# Patient Record
Sex: Female | Born: 1955 | Race: White | Hispanic: No | Smoking: Former smoker
Health system: Southern US, Community
[De-identification: ages and names within clinical notes are randomized; demographics above are authoritative.]

## PROBLEM LIST (undated history)

## (undated) DIAGNOSIS — M329 Systemic lupus erythematosus, unspecified: Secondary | ICD-10-CM

## (undated) DIAGNOSIS — Z973 Presence of spectacles and contact lenses: Secondary | ICD-10-CM

## (undated) DIAGNOSIS — M1990 Unspecified osteoarthritis, unspecified site: Secondary | ICD-10-CM

## (undated) DIAGNOSIS — R519 Headache, unspecified: Secondary | ICD-10-CM

## (undated) DIAGNOSIS — Z9889 Other specified postprocedural states: Secondary | ICD-10-CM

## (undated) DIAGNOSIS — R51 Headache: Secondary | ICD-10-CM

## (undated) DIAGNOSIS — R112 Nausea with vomiting, unspecified: Secondary | ICD-10-CM

## (undated) DIAGNOSIS — F419 Anxiety disorder, unspecified: Secondary | ICD-10-CM

## (undated) DIAGNOSIS — M3500 Sicca syndrome, unspecified: Secondary | ICD-10-CM

## (undated) DIAGNOSIS — Z972 Presence of dental prosthetic device (complete) (partial): Secondary | ICD-10-CM

## (undated) DIAGNOSIS — D0361 Melanoma in situ of right upper limb, including shoulder: Secondary | ICD-10-CM

## (undated) DIAGNOSIS — E039 Hypothyroidism, unspecified: Secondary | ICD-10-CM

## (undated) HISTORY — PX: DILATION AND CURETTAGE OF UTERUS: SHX78

## (undated) HISTORY — PX: TONSILLECTOMY: SUR1361

---

## 1982-03-15 HISTORY — PX: TUBAL LIGATION: SHX77

## 1988-11-13 HISTORY — PX: OTHER SURGICAL HISTORY: SHX169

## 1988-11-13 HISTORY — PX: CYSTOSCOPY W/ RETROGRADES: SHX1426

## 1997-10-03 ENCOUNTER — Ambulatory Visit (HOSPITAL_COMMUNITY): Admission: RE | Admit: 1997-10-03 | Discharge: 1997-10-03 | Payer: Self-pay | Admitting: Gastroenterology

## 1997-10-14 ENCOUNTER — Ambulatory Visit (HOSPITAL_COMMUNITY): Admission: RE | Admit: 1997-10-14 | Discharge: 1997-10-14 | Payer: Self-pay | Admitting: Gastroenterology

## 1997-11-19 ENCOUNTER — Ambulatory Visit (HOSPITAL_COMMUNITY): Admission: RE | Admit: 1997-11-19 | Discharge: 1997-11-19 | Payer: Self-pay | Admitting: Gastroenterology

## 1997-11-27 ENCOUNTER — Ambulatory Visit (HOSPITAL_COMMUNITY): Admission: RE | Admit: 1997-11-27 | Discharge: 1997-11-27 | Payer: Self-pay | Admitting: Gastroenterology

## 1997-11-27 ENCOUNTER — Encounter: Payer: Self-pay | Admitting: Gastroenterology

## 1997-12-06 ENCOUNTER — Encounter: Payer: Self-pay | Admitting: Gastroenterology

## 1997-12-06 ENCOUNTER — Ambulatory Visit (HOSPITAL_COMMUNITY): Admission: RE | Admit: 1997-12-06 | Discharge: 1997-12-06 | Payer: Self-pay | Admitting: Gastroenterology

## 1998-09-10 ENCOUNTER — Other Ambulatory Visit: Admission: RE | Admit: 1998-09-10 | Discharge: 1998-09-10 | Payer: Self-pay | Admitting: Obstetrics and Gynecology

## 1999-09-29 ENCOUNTER — Encounter: Payer: Self-pay | Admitting: Obstetrics and Gynecology

## 1999-09-29 ENCOUNTER — Encounter: Admission: RE | Admit: 1999-09-29 | Discharge: 1999-09-29 | Payer: Self-pay | Admitting: Obstetrics and Gynecology

## 1999-10-12 ENCOUNTER — Other Ambulatory Visit: Admission: RE | Admit: 1999-10-12 | Discharge: 1999-10-12 | Payer: Self-pay | Admitting: Obstetrics and Gynecology

## 2000-10-12 ENCOUNTER — Other Ambulatory Visit: Admission: RE | Admit: 2000-10-12 | Discharge: 2000-10-12 | Payer: Self-pay | Admitting: Obstetrics and Gynecology

## 2000-10-13 ENCOUNTER — Encounter: Admission: RE | Admit: 2000-10-13 | Discharge: 2000-10-13 | Payer: Self-pay | Admitting: Obstetrics and Gynecology

## 2000-10-13 ENCOUNTER — Encounter: Payer: Self-pay | Admitting: Obstetrics and Gynecology

## 2002-08-01 ENCOUNTER — Encounter: Payer: Self-pay | Admitting: Obstetrics and Gynecology

## 2002-08-01 ENCOUNTER — Encounter: Admission: RE | Admit: 2002-08-01 | Discharge: 2002-08-01 | Payer: Self-pay | Admitting: Obstetrics and Gynecology

## 2003-09-19 ENCOUNTER — Encounter: Admission: RE | Admit: 2003-09-19 | Discharge: 2003-09-19 | Payer: Self-pay | Admitting: Obstetrics and Gynecology

## 2004-11-04 ENCOUNTER — Encounter: Admission: RE | Admit: 2004-11-04 | Discharge: 2004-11-04 | Payer: Self-pay | Admitting: Obstetrics and Gynecology

## 2005-10-28 ENCOUNTER — Encounter: Admission: RE | Admit: 2005-10-28 | Discharge: 2005-10-28 | Payer: Self-pay | Admitting: Obstetrics and Gynecology

## 2005-11-04 ENCOUNTER — Encounter: Admission: RE | Admit: 2005-11-04 | Discharge: 2005-11-04 | Payer: Self-pay | Admitting: Obstetrics and Gynecology

## 2006-01-27 ENCOUNTER — Encounter: Admission: RE | Admit: 2006-01-27 | Discharge: 2006-01-27 | Payer: Self-pay | Admitting: Family Medicine

## 2006-10-31 ENCOUNTER — Encounter: Admission: RE | Admit: 2006-10-31 | Discharge: 2006-10-31 | Payer: Self-pay | Admitting: Obstetrics and Gynecology

## 2007-11-01 ENCOUNTER — Encounter: Admission: RE | Admit: 2007-11-01 | Discharge: 2007-11-01 | Payer: Self-pay | Admitting: Obstetrics and Gynecology

## 2008-04-15 ENCOUNTER — Encounter: Admission: RE | Admit: 2008-04-15 | Discharge: 2008-04-15 | Payer: Self-pay | Admitting: Family Medicine

## 2009-12-17 ENCOUNTER — Encounter: Payer: Self-pay | Admitting: Pulmonary Disease

## 2009-12-25 ENCOUNTER — Encounter: Payer: Self-pay | Admitting: Pulmonary Disease

## 2009-12-25 ENCOUNTER — Encounter: Admission: RE | Admit: 2009-12-25 | Discharge: 2009-12-25 | Payer: Self-pay | Admitting: Rheumatology

## 2009-12-29 ENCOUNTER — Encounter: Payer: Self-pay | Admitting: Pulmonary Disease

## 2010-01-12 DIAGNOSIS — M329 Systemic lupus erythematosus, unspecified: Secondary | ICD-10-CM | POA: Insufficient documentation

## 2010-01-12 DIAGNOSIS — E039 Hypothyroidism, unspecified: Secondary | ICD-10-CM | POA: Insufficient documentation

## 2010-01-12 DIAGNOSIS — R0602 Shortness of breath: Secondary | ICD-10-CM | POA: Insufficient documentation

## 2010-01-13 ENCOUNTER — Ambulatory Visit: Payer: Self-pay | Admitting: Pulmonary Disease

## 2010-01-13 DIAGNOSIS — J984 Other disorders of lung: Secondary | ICD-10-CM | POA: Insufficient documentation

## 2010-01-15 ENCOUNTER — Ambulatory Visit: Payer: Self-pay | Admitting: Pulmonary Disease

## 2010-04-05 ENCOUNTER — Encounter: Payer: Self-pay | Admitting: Obstetrics and Gynecology

## 2010-04-14 NOTE — Assessment & Plan Note (Signed)
Summary: consult for dyspnea   Visit Type:  Initial Consult Copy to:  Zenovia Jordan MD Primary Gwendloyn Forsee/Referring Ediberto Sens:  Lupita Raider  CC:  pulonary consult. pt increased sob with exertion and at rest. pt has increased dizziness.  History of Present Illness: The pt is a 54y/o female who I have been asked to see for dyspnea.  She has a diagnosis of systemic lupus, and has noted some increased doe over the past 2mos.  She has seen this with brisk ADL's and on rare occasions with conversation.  Has also noted with her housework.  It is not an overwhelming feeling of sob, but feels different than her usual baseline.  She often feels she cannot "get a deep enough breath", and on occasions can have air hunger.  She denies any LE edema, and has no cardiac history of significance.  She denies any cough or mucus production, and has not had any pleuritic chest pain.  She has had some reflux symptoms with belching.  Her lupus is being treated with prednisone, mtx, and plaquenil, although her mtx has been held for the last 3 weeks.  Her TSH was recently found to be elevated, and her medication is being adjusted.  The pt does have a h/o minimal smoking for 24yrs, but quit one year ago.  She has been evaluated with an echo which shows normal LV fxn and no evidence for pulmonary htn.  She has had a ct chest which showed no ISLD, but did show an incidental 2.62mm nodule in the RLL.  Current Medications (verified): 1)  Vitamin D 1000 Unit Tabs (Cholecalciferol) .Marland Kitchen.. 1 Once Daily 2)  Prednisone 5 Mg Tabs (Prednisone) .Marland Kitchen.. 1 Once Daily 3)  Ambien 5 Mg Tabs (Zolpidem Tartrate) .Marland Kitchen.. 1 At Bedtime As Needed 4)  Synthroid 100 Mcg Tabs (Levothyroxine Sodium) .... Once Daily 5)  Hydroxychloroquine Sulfate 200 Mg Tabs (Hydroxychloroquine Sulfate) .... 2 Once Daily 6)  Folic Acid 1 Mg Tabs (Folic Acid) .Marland Kitchen.. 1 Once Daily 7)  Flonase 50 Mcg/act Susp (Fluticasone Propionate) .Marland Kitchen.. 1 Sray Each Nostril Once Daily As Needed 8)   Align  Caps (Probiotic Product) .... Once Daily  Allergies (verified): No Known Drug Allergies  Past History:  Past Medical History:  UNSPECIFIED HYPOTHYROIDISM (ICD-244.9) SYSTEMIC LUPUS ERYTHEMATOSUS (ICD-710.0)    Past Surgical History: left/right  breast lumpectomy tubal ligation  Family History: Reviewed history from 01/12/2010 and no changes required. HTN- Father Hyperlipidemia- Father and Mother AODM- Mother cancer--father  Social History: Reviewed history from 01/12/2010 and no changes required. Former smoker. quit smoking 2010. started 2006. 1/2 ppd.  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  The patient denies productive cough, non-productive cough, coughing up blood, chest pain, irregular heartbeats, acid heartburn, loss of appetite, difficulty swallowing, tooth/dental problems, headaches, sneezing, itching, ear ache, anxiety, depression, hand/feet swelling, joint stiffness or pain, rash, change in color of mucus, and fever.    Vital Signs:  Patient profile:   55 year old female Height:      68 inches Weight:      125.50 pounds BMI:     19.15 O2 Sat:      99 % on Room air Temp:     98.4 degrees F oral Pulse rate:   82 / minute BP sitting:  112 / 78  (left arm) Cuff size:   regular  Vitals Entered By: Carver Fila (January 13, 2010 11:37 AM)  O2 Flow:  Room air CC: pulonary consult. pt increased sob with exertion and at rest. pt has increased dizziness Comments meds and alllergies updated Phone number updated Carver Fila  January 13, 2010 11:37 AM    Physical Exam  General:  thin female in nad Eyes:  PERRLA and EOMI.   Nose:  patent without discharge Mouth:  clear, no exudates or lesions noted Neck:  no  jvd,tmg, LN Lungs:  totally clear to auscultation Heart:  rrr, no mrg Abdomen:  soft and nontender, bs+ Extremities:  no edema or cyanosis, pulses intact distally Neurologic:  alert and oriented, moves all 4.   Impression & Recommendations:  Problem # 1:  DYSPNEA (ICD-786.05) the pt has doe that is interfering with her QOL.  She has a h/o lupus, but her HRCT shows no ISLD and her echo does not show pulmonary htn.  She has a h/o smoking, but minimal.  I do think she needs to have full pfts for evaluation to exclude obstructive or restrictive disease.  SLE can affect diaphragm function, and she does c/o "inability to take a full breath".  She also has a h/o c/w reflux, and it may be worthwhile to treat her with a ppi if nothing else is found (I have seen pts with reflux presenting with sob).  If her pfts are unremarkable, and she continues to have symptoms, it may be worthwhile doing cardiopulmonary stress testing (CPST) with exercise bike and inhale/exhale gas analysis.  Problem # 2:  PULMONARY NODULE, RIGHT LOWER LOBE (ICD-518.89) the pt was found to have an incidental 2.59mm nodule in RLL on ct chest.  She is low risk for lung cancer, but her health maintenance is not up to date per pt.  Would recommend a f/u ct in one year, and I have asked pt to get back with her primary md to catch up on general health maintenance.  Medications Added to Medication List This Visit: 1)  Synthroid 100 Mcg Tabs (Levothyroxine sodium) .... Once daily 2)  Align Caps (Probiotic product) .... Once daily  Other Orders: Consultation Level IV (01027) Pulmonary Referral (Pulmonary)  Patient Instructions: 1)  will for breathing tests, and will call you with results. 2)  will send a copy of my note to Dr Nickola Major and your primary MD.

## 2010-04-14 NOTE — Miscellaneous (Signed)
Summary: Orders Update pft charges  Clinical Lists Changes  Orders: Added new Service order of Carbon Monoxide diffusing w/capacity [phone]) - Signed Added new Service order of Lung Volumes (60454) - Signed Added new Service order of Spirometry (Pre & Post) (09811) - Signed  Appended Document: Orders Update pft charges pfts normal.  discussed with pt....she would like to see how her symptoms go at this point.  Will work on conditioning.

## 2010-04-14 NOTE — Letter (Signed)
Summary: [facility]   Imported By: Sherian Rein 02/02/2010 07:49:17  _____________________________________________________________________  External Attachment:    Type:   Image     Comment:   External Document

## 2010-10-08 ENCOUNTER — Other Ambulatory Visit: Payer: Self-pay | Admitting: Family Medicine

## 2010-10-08 DIAGNOSIS — Z1231 Encounter for screening mammogram for malignant neoplasm of breast: Secondary | ICD-10-CM

## 2010-10-19 ENCOUNTER — Ambulatory Visit
Admission: RE | Admit: 2010-10-19 | Discharge: 2010-10-19 | Disposition: A | Discharge status: Home / Self Care | Payer: 59 | Source: Ambulatory Visit | Attending: Family Medicine | Admitting: Family Medicine

## 2010-10-19 DIAGNOSIS — Z1231 Encounter for screening mammogram for malignant neoplasm of breast: Secondary | ICD-10-CM

## 2011-09-21 ENCOUNTER — Other Ambulatory Visit: Payer: Self-pay | Admitting: Family Medicine

## 2011-09-21 DIAGNOSIS — Z1231 Encounter for screening mammogram for malignant neoplasm of breast: Secondary | ICD-10-CM

## 2011-10-26 ENCOUNTER — Ambulatory Visit
Admission: RE | Admit: 2011-10-26 | Discharge: 2011-10-26 | Disposition: A | Discharge status: Home / Self Care | Payer: 59 | Source: Ambulatory Visit | Attending: Family Medicine | Admitting: Family Medicine

## 2011-10-26 DIAGNOSIS — Z1231 Encounter for screening mammogram for malignant neoplasm of breast: Secondary | ICD-10-CM

## 2012-08-01 ENCOUNTER — Other Ambulatory Visit: Payer: Self-pay | Admitting: Family Medicine

## 2012-08-01 DIAGNOSIS — E28319 Asymptomatic premature menopause: Secondary | ICD-10-CM

## 2012-08-01 DIAGNOSIS — IMO0002 Reserved for concepts with insufficient information to code with codable children: Secondary | ICD-10-CM

## 2012-08-14 ENCOUNTER — Ambulatory Visit
Admission: RE | Admit: 2012-08-14 | Discharge: 2012-08-14 | Disposition: A | Discharge status: Home / Self Care | Payer: 59 | Source: Ambulatory Visit | Attending: Family Medicine | Admitting: Family Medicine

## 2012-08-14 DIAGNOSIS — E28319 Asymptomatic premature menopause: Secondary | ICD-10-CM

## 2012-08-14 DIAGNOSIS — IMO0002 Reserved for concepts with insufficient information to code with codable children: Secondary | ICD-10-CM

## 2012-10-11 ENCOUNTER — Other Ambulatory Visit: Payer: Self-pay

## 2012-10-11 DIAGNOSIS — Z1231 Encounter for screening mammogram for malignant neoplasm of breast: Secondary | ICD-10-CM

## 2012-10-30 ENCOUNTER — Ambulatory Visit
Admission: RE | Admit: 2012-10-30 | Discharge: 2012-10-30 | Disposition: A | Discharge status: Home / Self Care | Payer: 59 | Source: Ambulatory Visit

## 2012-10-30 DIAGNOSIS — Z1231 Encounter for screening mammogram for malignant neoplasm of breast: Secondary | ICD-10-CM

## 2013-07-18 ENCOUNTER — Other Ambulatory Visit: Payer: Self-pay | Admitting: Family Medicine

## 2013-07-18 ENCOUNTER — Other Ambulatory Visit (HOSPITAL_COMMUNITY)
Admission: RE | Admit: 2013-07-18 | Discharge: 2013-07-18 | Disposition: A | Discharge status: Home / Self Care | Payer: 59 | Source: Ambulatory Visit | Attending: Family Medicine | Admitting: Family Medicine

## 2013-07-18 DIAGNOSIS — Z1151 Encounter for screening for human papillomavirus (HPV): Secondary | ICD-10-CM | POA: Insufficient documentation

## 2013-07-18 DIAGNOSIS — Z124 Encounter for screening for malignant neoplasm of cervix: Secondary | ICD-10-CM | POA: Insufficient documentation

## 2014-09-24 ENCOUNTER — Other Ambulatory Visit: Payer: Self-pay

## 2014-09-24 DIAGNOSIS — Z1231 Encounter for screening mammogram for malignant neoplasm of breast: Secondary | ICD-10-CM

## 2014-09-27 ENCOUNTER — Other Ambulatory Visit: Payer: Self-pay | Admitting: Family Medicine

## 2014-09-27 DIAGNOSIS — M8580 Other specified disorders of bone density and structure, unspecified site: Secondary | ICD-10-CM

## 2014-10-16 ENCOUNTER — Ambulatory Visit

## 2014-10-16 ENCOUNTER — Ambulatory Visit
Admission: RE | Admit: 2014-10-16 | Discharge: 2014-10-16 | Disposition: A | Discharge status: Home / Self Care | Payer: 59 | Source: Ambulatory Visit

## 2014-10-16 ENCOUNTER — Ambulatory Visit
Admission: RE | Admit: 2014-10-16 | Discharge: 2014-10-16 | Disposition: A | Discharge status: Home / Self Care | Payer: 59 | Source: Ambulatory Visit | Attending: Family Medicine | Admitting: Family Medicine

## 2014-10-16 DIAGNOSIS — M8580 Other specified disorders of bone density and structure, unspecified site: Secondary | ICD-10-CM

## 2014-10-16 DIAGNOSIS — Z1231 Encounter for screening mammogram for malignant neoplasm of breast: Secondary | ICD-10-CM

## 2015-05-14 ENCOUNTER — Other Ambulatory Visit: Payer: Self-pay | Admitting: Family Medicine

## 2015-05-21 ENCOUNTER — Telehealth: Payer: Self-pay | Admitting: General Surgery

## 2015-05-21 ENCOUNTER — Encounter: Payer: Self-pay | Admitting: General Surgery

## 2015-05-21 NOTE — Telephone Encounter (Signed)
new patient appt-s/w Diane @ Dr. Brigitte Pulse office and gave np appt for 03/14 @ 1 w/Dr. Rolanda Jay.  Referring Dr. [name] Dx- Dem Malignant forearm  Referral information scan under media tab

## 2015-05-27 ENCOUNTER — Encounter: Payer: Self-pay | Admitting: General Surgery

## 2015-05-27 ENCOUNTER — Encounter: Payer: Self-pay | Admitting: *Deleted

## 2015-05-27 ENCOUNTER — Ambulatory Visit (HOSPITAL_BASED_OUTPATIENT_CLINIC_OR_DEPARTMENT_OTHER): Payer: 59 | Admitting: General Surgery

## 2015-05-27 VITALS — BP 139/65 | HR 94 | Temp 98.3°F | Resp 20 | Ht 67.0 in | Wt 121.8 lb

## 2015-05-27 DIAGNOSIS — Z803 Family history of malignant neoplasm of breast: Secondary | ICD-10-CM

## 2015-05-27 DIAGNOSIS — C4361 Malignant melanoma of right upper limb, including shoulder: Secondary | ICD-10-CM | POA: Diagnosis not present

## 2015-05-27 DIAGNOSIS — Z87891 Personal history of nicotine dependence: Secondary | ICD-10-CM | POA: Diagnosis not present

## 2015-05-27 DIAGNOSIS — Z809 Family history of malignant neoplasm, unspecified: Secondary | ICD-10-CM

## 2015-05-27 NOTE — Progress Notes (Signed)
[facility] NOTE  Patient Care Team: [name], MD as PCP - General (Family Medicine)  CHIEF COMPLAINTS/PURPOSE OF CONSULTATION: Newly diagnosed malignant melanoma right forearm  HISTORY OF PRESENTING ILLNESS:  Mrs. Kreke is a very pleasant 60 year old lady who reports seeing her primary care physician in July of last year with an abnormal mole of the right mid third dorsal forearm. This was placed in surveillance and she reported that in the Fall and began to change where she describes it coming up to a head and spontaneously drained a small amount of fluid and became thicker. After the under holidays she return for further evaluation and a biopsy was performed with the diagnosis of malignant melanoma. She was referred here for further evaluation and treatment. She denies any history of abnormal mole or abnormal syndrome. She denies any prior history of malignant melanoma. She denies any history of blistering sunburns.   I reviewed her records extensively and collaborated the history with the patient.  SUMMARY OF ONCOLOGIC HISTORY:  Newly diagnosed right mid third dorsal forearm malignant melanoma     MEDICAL HISTORY:  Systemic lupus. Sjgren syndrome. Hypothyroidism. Anxiety. Newly diagnosed malignant melanoma right forearm.   SURGICAL HISTORY: Tonsillectomy in her 13s. Tubal ligation. Diagnostic laparoscopy for recurrent urinary tract infections. Excisional breast biopsies 2 with benign diagnoses. Prior mole excisions all of which were benign.  SOCIAL HISTORY: Social History   Social History  . Marital Status: Married       . Number of Children: 2  .     Occupational History  . Not on file.   Social History Main Topics  . Smoking status: Quit in 2012   .    Marland Kitchen Alcohol Use: None   .    Marland Kitchen     Other Topics Concern  . Not on file   Social History Narrative  .     FAMILY HISTORY: Maternal grandmother with a history of breast cancer  deceased at age 73. He has a daughter with a history of myasthenia gravis successfully treated with thymectomy. Her father's deceased with a diagnosis of cancer she is unsure what kind but thinks she may have heard it had something to do with melanoma. Son who is alive with a history of hypothyroidism. Her daughter who had myasthenia gravis also had multiple moles and a lesion that was described as pre-melanotic excised.  ALLERGIES:  has No Known Allergies.  MEDICATIONS:  Current Outpatient Prescriptions  Medication Sig Dispense Refill  . ALPRAZolam (XANAX) 0.5 MG tablet TAKE 1/2 TO 1 TABLET 3 TIMES A DAY AS NEEDED  0  . Cholecalciferol (VITAMIN D) 2000 units CAPS Take 2,000 Units by mouth daily.    . folic acid (FOLVITE) 1 MG tablet Take 1 mg by mouth 2 (two) times daily.    . hydroxychloroquine (PLAQUENIL) 200 MG tablet Take 200 mg by mouth 2 (two) times daily.    Marland Kitchen levothyroxine (SYNTHROID, LEVOTHROID) 100 MCG tablet Take 100 mcg by mouth daily before breakfast.    . magic mouthwash SOLN SWISH 1 TO 2 TEASPOONS BY MOUTH TWICE A DAY  12  . methotrexate (RHEUMATREX) 2.5 MG tablet Take 2.5 mg by mouth once a week. Caution:Chemotherapy. Protect from light.    Marland Kitchen POTASSIUM PO Take 595 mg by mouth daily.    . predniSONE (DELTASONE) 5 MG tablet Take 5 mg by mouth daily.    . Probiotic Product (HEALTHY COLON PO) Take 1 tablet by mouth daily.    Marland Kitchen  vitamin E 400 UNIT capsule Take 400 Units by mouth daily.    Marland Kitchen zolpidem (AMBIEN) 5 MG tablet Take 5 mg by mouth at bedtime as needed for sleep.     No current facility-administered medications for this visit.    REVIEW OF SYSTEMS:   Constitutional: Denies fevers, chills or abnormal night sweats Eyes: Denies blurriness of vision, double vision or watery eyes Ears, nose, mouth, throat, and face: Denies mucositis or sore throat Respiratory: Denies cough, dyspnea or wheezes Cardiovascular: Denies palpitation, chest discomfort or lower extremity  swelling Gastrointestinal:  Denies nausea, heartburn or change in bowel habits Skin: Denies abnormal skin rashes Lymphatics: Denies new lymphadenopathy or easy bruising Neurological:Denies numbness, tingling or new weaknesses Behavioral/Psych: Mood is stable, no new changes  Breast:  Denies any palpable lumps or discharge All other systems were reviewed with the patient and are negative.  PHYSICAL EXAMINATION: ECOG PERFORMANCE STATUS: 0 - Asymptomatic  Filed Vitals:   05/27/15 1249  BP: 139/65  Pulse: 94  Temp: 98.3 F (36.8 C)  Resp: 20   Filed Weights   05/27/15 1249  Weight: 121 lb 12.8 oz (55.248 kg)    GENERAL:alert, no distress and comfortable SKIN: Full skin exam performed with female chaperone in room.  Scabbed biopsy site on the dorsal mid-third right forearm with no remaining lesion.( measures 1.0 x 0.7 cm).  Otherwise, skin color, texture, turgor are normal, no rashes or significant lesions. EYES: normal, conjunctiva are pink and non-injected, sclera clear OROPHARYNX:no exudate, no erythema and lips, buccal mucosa, and tongue normal  NECK: supple, thyroid normal size, non-tender, without nodularity LYMPH:  no palpable lymphadenopathy in the cervical, axillary or inguinal LUNGS: clear to auscultation and percussion with normal breathing effort HEART: regular rate & rhythm and no murmurs and no lower extremity edema ABDOMEN:abdomen soft, non-tender and normal bowel sounds Musculoskeletal:no cyanosis of digits and no clubbing  PSYCH: alert & oriented x 3 with fluent speech NEURO: no focal motor/sensory deficits Lymphatics: No axillary or supraclavicular lymphadenopathy (exam performed in the presence of a chaperone) No inguino-femoral lymphadenopathy.  PATHOLOGY: Skin, right forearm, shave Malignant Melanoma 1.20 mm Clark's level III Ulceration: absent Mitotic rate greater than or equal to 1/mm squared Regression: absent  Satellitosis: Cannot be  identified Pathologic Stage: pT2a  ASSESSMENT AND PLAN:  Patient with a newly diagnosed Right Mid-third dorsal forearm malignant melanoma clinical staged pT2a, N0, M0 (Stage IB).   I had a long discussion with the patient and her husband regarding the diagnosis, stage and treatment recommendations (NCCN).  I have recommended a wide local excision with 1.0 cm margins with consideration for sentinel lymph node biopsy.  They understand that the resection will require an elongated elliptical incision to allow for primary closure. They also understand that while a sentinel lymph node biopsy is important for staging its impact on his overall survival is unclear. She also understands that due to her use of prednisone and methotrexate that she is at higher risk for wound healing complications. We also discussed the risk of sentinel lymph node biopsy including lymphedema and pain.  She is elected to move forward with a wide local excision of the right dorsal mid forearm malignant melanoma with 1.0 cm margins and sentinel lymph node mapping. We'll be scheduling her for surgery in the near future.   All questions were answered. The patient knows to call the clinic with any problems, questions or concerns.    Frederich Cha, MD 2:24 PM

## 2015-05-27 NOTE — Patient Instructions (Signed)
Plan for surgery with Dr. Rolanda Jay at the [facility] on March 21 at 2pm.  We will have you meet with anesthesia before your procedure as well.  You will also receive a phone call from the presurgical RN at the [facility].  Please call [phone] for any questions.  You will be scheduled for a wide local excision of the right forearm and sentinel lymph node biopsy within the right axilla or underarm.

## 2015-05-28 ENCOUNTER — Other Ambulatory Visit: Payer: Self-pay | Admitting: Gynecologic Oncology

## 2015-05-28 ENCOUNTER — Telehealth: Payer: Self-pay | Admitting: Gynecologic Oncology

## 2015-05-28 DIAGNOSIS — C4361 Malignant melanoma of right upper limb, including shoulder: Secondary | ICD-10-CM

## 2015-05-28 NOTE — Telephone Encounter (Signed)
Left message for Lauren Mccoy with Radiology scheduling about the patient needing to have nuclear med involved in her case at [facility] on March 21 at 2 pm for sentinel lymph node mapping/biopsy for malignant melanoma.

## 2015-05-29 ENCOUNTER — Other Ambulatory Visit: Payer: Self-pay | Admitting: Gynecologic Oncology

## 2015-05-29 ENCOUNTER — Telehealth: Payer: Self-pay | Admitting: Gynecologic Oncology

## 2015-05-29 DIAGNOSIS — C4361 Malignant melanoma of right upper limb, including shoulder: Secondary | ICD-10-CM

## 2015-05-29 NOTE — Telephone Encounter (Signed)
Informed patient that we are trying to get her nuclear medicine lymph node study arranged prior to her procedure but it did not appear that there were openings so her surgical date may have to be adjusted.

## 2015-05-29 NOTE — Telephone Encounter (Signed)
Left message for patient on her cell about trying to get her nuclear medicine lymph node study arranged prior to her procedure but it did not appear that there were openings so her surgical date may have to be adjusted.

## 2015-05-29 NOTE — Progress Notes (Signed)
Per Dr. Rolanda Jay, he requests sentinel node injection the morning of surgery with images.

## 2015-05-30 ENCOUNTER — Ambulatory Visit (HOSPITAL_COMMUNITY): Payer: 59

## 2015-06-02 ENCOUNTER — Encounter (HOSPITAL_BASED_OUTPATIENT_CLINIC_OR_DEPARTMENT_OTHER): Payer: Self-pay | Admitting: *Deleted

## 2015-06-02 ENCOUNTER — Telehealth: Payer: Self-pay | Admitting: Gynecologic Oncology

## 2015-06-02 NOTE — Telephone Encounter (Signed)
Called patient and informed the patient that Dr. Rolanda Jay felt she could proceed with surgery without lymph node mapping.  She would still have the injection 90 min before the procedure.  Advised the patient that the previously scheduled time for March 21 at 2 pm was still available.  Patient stating she was fine to move her surgery back to March 21.  Spoke with surgery scheduling and moved the surgery and also spoke with Vivien Rota in Radiology scheduling and cancelled the NM lymph node mapping.  Patient advised to call for any questions or concerns.

## 2015-06-02 NOTE — Progress Notes (Signed)
Chart reviewed by Dr Al Corpus and he states patient does not need to come in advance for consult, he will see her DOS.

## 2015-06-03 ENCOUNTER — Ambulatory Visit (HOSPITAL_BASED_OUTPATIENT_CLINIC_OR_DEPARTMENT_OTHER): Payer: 59 | Admitting: Certified Registered"

## 2015-06-03 ENCOUNTER — Ambulatory Visit (HOSPITAL_COMMUNITY)
Admission: RE | Admit: 2015-06-03 | Discharge: 2015-06-03 | Disposition: A | Discharge status: Home / Self Care | Payer: 59 | Source: Ambulatory Visit | Attending: Gynecologic Oncology | Admitting: Gynecologic Oncology

## 2015-06-03 ENCOUNTER — Other Ambulatory Visit (HOSPITAL_COMMUNITY): Payer: 59

## 2015-06-03 ENCOUNTER — Encounter (HOSPITAL_BASED_OUTPATIENT_CLINIC_OR_DEPARTMENT_OTHER)
Admission: RE | Disposition: A | Discharge status: Home / Self Care | Payer: Self-pay | Source: Ambulatory Visit | Attending: General Surgery

## 2015-06-03 ENCOUNTER — Ambulatory Visit (HOSPITAL_BASED_OUTPATIENT_CLINIC_OR_DEPARTMENT_OTHER)
Admission: RE | Admit: 2015-06-03 | Discharge: 2015-06-03 | Disposition: A | Discharge status: Home / Self Care | Payer: 59 | Source: Ambulatory Visit | Attending: General Surgery | Admitting: General Surgery

## 2015-06-03 ENCOUNTER — Encounter (HOSPITAL_BASED_OUTPATIENT_CLINIC_OR_DEPARTMENT_OTHER): Payer: Self-pay | Admitting: *Deleted

## 2015-06-03 DIAGNOSIS — Z79899 Other long term (current) drug therapy: Secondary | ICD-10-CM | POA: Insufficient documentation

## 2015-06-03 DIAGNOSIS — Z7952 Long term (current) use of systemic steroids: Secondary | ICD-10-CM | POA: Insufficient documentation

## 2015-06-03 DIAGNOSIS — Z87891 Personal history of nicotine dependence: Secondary | ICD-10-CM | POA: Insufficient documentation

## 2015-06-03 DIAGNOSIS — M3500 Sicca syndrome, unspecified: Secondary | ICD-10-CM | POA: Diagnosis not present

## 2015-06-03 DIAGNOSIS — Z803 Family history of malignant neoplasm of breast: Secondary | ICD-10-CM | POA: Diagnosis not present

## 2015-06-03 DIAGNOSIS — M329 Systemic lupus erythematosus, unspecified: Secondary | ICD-10-CM | POA: Diagnosis not present

## 2015-06-03 DIAGNOSIS — C773 Secondary and unspecified malignant neoplasm of axilla and upper limb lymph nodes: Secondary | ICD-10-CM | POA: Insufficient documentation

## 2015-06-03 DIAGNOSIS — E039 Hypothyroidism, unspecified: Secondary | ICD-10-CM | POA: Diagnosis not present

## 2015-06-03 DIAGNOSIS — C4361 Malignant melanoma of right upper limb, including shoulder: Secondary | ICD-10-CM

## 2015-06-03 DIAGNOSIS — F419 Anxiety disorder, unspecified: Secondary | ICD-10-CM | POA: Insufficient documentation

## 2015-06-03 HISTORY — DX: Anxiety disorder, unspecified: F41.9

## 2015-06-03 HISTORY — DX: Headache: R51

## 2015-06-03 HISTORY — DX: Headache, unspecified: R51.9

## 2015-06-03 HISTORY — DX: Other specified postprocedural states: Z98.890

## 2015-06-03 HISTORY — DX: Other specified postprocedural states: R11.2

## 2015-06-03 HISTORY — DX: Hypothyroidism, unspecified: E03.9

## 2015-06-03 HISTORY — DX: Unspecified osteoarthritis, unspecified site: M19.90

## 2015-06-03 HISTORY — PX: EXCISION MELANOMA WITH SENTINEL LYMPH NODE BIOPSY: SHX5628

## 2015-06-03 SURGERY — EXCISION, MELANOMA, WITH SENTINEL LYMPH NODE BIOPSY
Anesthesia: General | Laterality: Right

## 2015-06-03 MED ORDER — LIDOCAINE HCL (CARDIAC) 20 MG/ML IV SOLN
INTRAVENOUS | Status: DC | PRN
  Administered 2015-06-03: 80 mg via INTRAVENOUS

## 2015-06-03 MED ORDER — SCOPOLAMINE 1 MG/3DAYS TD PT72
1.0000 | MEDICATED_PATCH | Freq: Once | TRANSDERMAL | Status: AC | PRN
  Administered 2015-06-03: 1 via TRANSDERMAL
  Administered 2015-06-03: 1.5 mg via TRANSDERMAL

## 2015-06-03 MED ORDER — HYDROMORPHONE HCL 1 MG/ML IJ SOLN
INTRAMUSCULAR | Status: AC
  Filled 2015-06-03: qty 1

## 2015-06-03 MED ORDER — BUPIVACAINE-EPINEPHRINE (PF) 0.25% -1:200000 IJ SOLN
INTRAMUSCULAR | Status: AC
  Filled 2015-06-03: qty 30

## 2015-06-03 MED ORDER — FENTANYL CITRATE (PF) 100 MCG/2ML IJ SOLN
50.0000 ug | INTRAMUSCULAR | Status: DC | PRN
  Administered 2015-06-03 (×2): 100 ug via INTRAVENOUS

## 2015-06-03 MED ORDER — OXYCODONE HCL 5 MG/5ML PO SOLN: 5.0000 mg | Freq: Once | ORAL | Status: DC | PRN

## 2015-06-03 MED ORDER — MIDAZOLAM HCL 2 MG/2ML IJ SOLN
INTRAMUSCULAR | Status: AC
  Filled 2015-06-03: qty 2

## 2015-06-03 MED ORDER — CEFAZOLIN SODIUM-DEXTROSE 2-3 GM-% IV SOLR
2.0000 g | Freq: Once | INTRAVENOUS | Status: AC
  Administered 2015-06-03: 2 g via INTRAVENOUS

## 2015-06-03 MED ORDER — MIDAZOLAM HCL 2 MG/2ML IJ SOLN
1.0000 mg | INTRAMUSCULAR | Status: DC | PRN
  Administered 2015-06-03 (×2): 2 mg via INTRAVENOUS

## 2015-06-03 MED ORDER — EPHEDRINE SULFATE 50 MG/ML IJ SOLN
INTRAMUSCULAR | Status: DC | PRN
  Administered 2015-06-03 (×2): 10 mg via INTRAVENOUS

## 2015-06-03 MED ORDER — DEXAMETHASONE SODIUM PHOSPHATE 10 MG/ML IJ SOLN
INTRAMUSCULAR | Status: AC
  Filled 2015-06-03: qty 1

## 2015-06-03 MED ORDER — BUPIVACAINE-EPINEPHRINE 0.25% -1:200000 IJ SOLN
INTRAMUSCULAR | Status: DC | PRN
  Administered 2015-06-03: 13 mL

## 2015-06-03 MED ORDER — OXYCODONE-ACETAMINOPHEN 5-325 MG PO TABS: 1.0000 | ORAL_TABLET | Freq: Four times a day (QID) | ORAL | Status: DC | PRN

## 2015-06-03 MED ORDER — ONDANSETRON HCL 4 MG/2ML IJ SOLN
INTRAMUSCULAR | Status: AC
  Filled 2015-06-03: qty 2

## 2015-06-03 MED ORDER — FENTANYL CITRATE (PF) 100 MCG/2ML IJ SOLN
INTRAMUSCULAR | Status: AC
  Filled 2015-06-03: qty 2

## 2015-06-03 MED ORDER — CEFAZOLIN SODIUM-DEXTROSE 2-3 GM-% IV SOLR
INTRAVENOUS | Status: AC
  Filled 2015-06-03: qty 50

## 2015-06-03 MED ORDER — HYDROMORPHONE HCL 1 MG/ML IJ SOLN
0.2500 mg | INTRAMUSCULAR | Status: DC | PRN
  Administered 2015-06-03 (×2): 0.5 mg via INTRAVENOUS

## 2015-06-03 MED ORDER — SCOPOLAMINE 1 MG/3DAYS TD PT72
MEDICATED_PATCH | TRANSDERMAL | Status: AC
  Filled 2015-06-03: qty 1

## 2015-06-03 MED ORDER — ONDANSETRON HCL 4 MG/2ML IJ SOLN
INTRAMUSCULAR | Status: DC | PRN
  Administered 2015-06-03: 4 mg via INTRAVENOUS

## 2015-06-03 MED ORDER — OXYCODONE HCL 5 MG PO TABS: 5.0000 mg | ORAL_TABLET | Freq: Once | ORAL | Status: DC | PRN

## 2015-06-03 MED ORDER — GLYCOPYRROLATE 0.2 MG/ML IJ SOLN: 0.2000 mg | Freq: Once | INTRAMUSCULAR | Status: DC | PRN

## 2015-06-03 MED ORDER — LACTATED RINGERS IV SOLN
INTRAVENOUS | Status: DC
  Administered 2015-06-03 (×3): via INTRAVENOUS

## 2015-06-03 MED ORDER — PROPOFOL 10 MG/ML IV BOLUS
INTRAVENOUS | Status: AC
  Filled 2015-06-03: qty 20

## 2015-06-03 MED ORDER — MEPERIDINE HCL 25 MG/ML IJ SOLN: 6.2500 mg | INTRAMUSCULAR | Status: DC | PRN

## 2015-06-03 MED ORDER — TECHNETIUM TC 99M SULFUR COLLOID FILTERED
0.5000 | Freq: Once | INTRAVENOUS | Status: AC | PRN
  Administered 2015-06-03: 0.5 via INTRADERMAL

## 2015-06-03 MED ORDER — LIDOCAINE HCL (CARDIAC) 20 MG/ML IV SOLN
INTRAVENOUS | Status: AC
  Filled 2015-06-03: qty 5

## 2015-06-03 MED ORDER — PROPOFOL 10 MG/ML IV BOLUS
INTRAVENOUS | Status: DC | PRN
  Administered 2015-06-03: 150 mg via INTRAVENOUS

## 2015-06-03 MED ORDER — DEXAMETHASONE SODIUM PHOSPHATE 4 MG/ML IJ SOLN
INTRAMUSCULAR | Status: DC | PRN
  Administered 2015-06-03: 10 mg via INTRAVENOUS

## 2015-06-03 MED ORDER — OXYCODONE-ACETAMINOPHEN 5-325 MG PO TABS: 2.0000 | ORAL_TABLET | Freq: Four times a day (QID) | ORAL | Status: DC | PRN

## 2015-06-03 SURGICAL SUPPLY — 36 items
APPLIER CLIP 9.375 MED OPEN (MISCELLANEOUS) ×3
BLADE SURG 15 STRL LF DISP TIS (BLADE) ×2 IMPLANT
BLADE SURG 15 STRL SS (BLADE) ×4
CHLORAPREP W/TINT 26ML (MISCELLANEOUS) ×3 IMPLANT
CLIP APPLIE 9.375 MED OPEN (MISCELLANEOUS) ×1 IMPLANT
COVER BACK TABLE 60X90IN (DRAPES) ×3 IMPLANT
COVER MAYO STAND STRL (DRAPES) ×3 IMPLANT
COVER PROBE W GEL 5X96 (DRAPES) ×3 IMPLANT
DERMABOND ADVANCED (GAUZE/BANDAGES/DRESSINGS) ×4
DERMABOND ADVANCED .7 DNX12 (GAUZE/BANDAGES/DRESSINGS) ×2 IMPLANT
DRAPE IMP U-DRAPE 54X76 (DRAPES) ×3 IMPLANT
DRAPE U-SHAPE 76X120 STRL (DRAPES) ×6 IMPLANT
DRAPE UTILITY XL STRL (DRAPES) ×3 IMPLANT
ELECT COATED BLADE 2.86 ST (ELECTRODE) ×3 IMPLANT
ELECT REM PT RETURN 9FT ADLT (ELECTROSURGICAL) ×3
ELECTRODE REM PT RTRN 9FT ADLT (ELECTROSURGICAL) ×1 IMPLANT
GLOVE BIO SURGEON STRL SZ7.5 (GLOVE) ×3 IMPLANT
GLOVE BIOGEL PI IND STRL 8 (GLOVE) ×1 IMPLANT
GLOVE BIOGEL PI INDICATOR 8 (GLOVE) ×2
GLOVE EXAM NITRILE LRG STRL (GLOVE) ×3 IMPLANT
GOWN STRL REUS W/ TWL LRG LVL3 (GOWN DISPOSABLE) ×2 IMPLANT
GOWN STRL REUS W/TWL LRG LVL3 (GOWN DISPOSABLE) ×4
LIGASURE ×3 IMPLANT
NEEDLE HYPO 25X1 1.5 SAFETY (NEEDLE) ×3 IMPLANT
NS IRRIG 1000ML POUR BTL (IV SOLUTION) ×3 IMPLANT
PACK BASIN DAY SURGERY FS (CUSTOM PROCEDURE TRAY) ×3 IMPLANT
PENCIL BUTTON HOLSTER BLD 10FT (ELECTRODE) ×3 IMPLANT
SHEET MEDIUM DRAPE 40X70 STRL (DRAPES) ×3 IMPLANT
SLEEVE SCD COMPRESS KNEE MED (MISCELLANEOUS) ×3 IMPLANT
SPONGE LAP 4X18 X RAY DECT (DISPOSABLE) ×3 IMPLANT
SUT MON AB 4-0 PC3 18 (SUTURE) ×6 IMPLANT
SUT SILK 2 0 FS (SUTURE) ×3 IMPLANT
SUT VICRYL 3-0 CR8 SH (SUTURE) ×6 IMPLANT
SYR CONTROL 10ML LL (SYRINGE) ×3 IMPLANT
TOWEL OR 17X24 6PK STRL BLUE (TOWEL DISPOSABLE) ×3 IMPLANT
TOWEL OR NON WOVEN STRL DISP B (DISPOSABLE) ×3 IMPLANT

## 2015-06-03 NOTE — Anesthesia Procedure Notes (Signed)
Procedure Name: LMA Insertion Date/Time: 06/03/2015 2:07 PM Performed by: Maryella Shivers Pre-anesthesia Checklist: Patient identified, Emergency Drugs available, Suction available and Patient being monitored Patient Re-evaluated:Patient Re-evaluated prior to inductionOxygen Delivery Method: Circle System Utilized Preoxygenation: Pre-oxygenation with 100% oxygen Intubation Type: IV induction Ventilation: Mask ventilation without difficulty LMA: LMA inserted LMA Size: 4.0 Number of attempts: 1 Airway Equipment and Method: Bite block Placement Confirmation: positive ETCO2 Tube secured with: Tape Dental Injury: Teeth and Oropharynx as per pre-operative assessment

## 2015-06-03 NOTE — Anesthesia Postprocedure Evaluation (Signed)
Anesthesia Post Note  Patient: Lauren Mccoy  Procedure(s) Performed: Procedure(s) (LRB): WIDE LOCAL RIGHT FOREARM EXCISION MELANOMA WITH SENTINEL LYMPH NODE MAPPING BIOPSY (Right)  Patient location during evaluation: PACU Anesthesia Type: General Level of consciousness: awake and alert Pain management: pain level controlled Vital Signs Assessment: post-procedure vital signs reviewed and stable Respiratory status: spontaneous breathing, nonlabored ventilation and respiratory function stable Cardiovascular status: blood pressure returned to baseline and stable Postop Assessment: no signs of nausea or vomiting Anesthetic complications: no    Last Vitals:  Filed Vitals:   06/03/15 1530 06/03/15 1545  BP: 120/62 119/68  Pulse: 81 78  Temp: 36.3 C   Resp: 15 19    Last Pain:  Filed Vitals:   06/03/15 1554  PainSc: 0-No pain                 Ferry Matthis A

## 2015-06-03 NOTE — Progress Notes (Signed)
Emotional support during nuclear medicine injections.  

## 2015-06-03 NOTE — Progress Notes (Signed)
Assisted Dr. Crews with right, ultrasound guided, pectoralis block. Side rails up, monitors on throughout procedure. See vital signs in flow sheet. Tolerated Procedure well. 

## 2015-06-03 NOTE — Discharge Instructions (Signed)

## 2015-06-03 NOTE — Op Note (Signed)
   Pre-operative Diagnosis: Right Forearm Malignant Melanoma  Procedure: Wide Local Excision Right Forearm Melanoma (2.8 cm)           Right Axillary Sentinel Lymph Node biopsy  Post-operative Diagnosis: Same   Surgeon: Malachi Bonds  Anesthesia:  0.25% Marcaine with epinephrine (local)                        Pectoral block                       General anesthesia  IVF: 2000  EBL: min  Drains: None  Complications: None; patient tolerated the procedure well.   Disposition: PACU - hemodynamically stable.   Condition: stable   Indication for Procedure: Patient is a 60yo lady with a newly diagnosed malignant melanoma now for WLE with 1.0 cm margins with sentinel lymph biopsy.  Operative Findings: Ex vivo count    19    Final background count 1  Procedure in Detail: Patient was identified and taken to the OR placed in the supine position.  After informed consent was verified and timeout was performed.  The previously marked biopsy site on the right forearm was infiltrated with local anesthetic.  A one cm margin was marked out around the biopsy site and fashioned into an elliptical longitudinally oriented incision.  This was incised and taken down to the underlying fasciae then elevated free.  It was oriented with sutures and passed off the field for permanent.  The wound was verified to be hemastatic and irrigated with sterile saline.  It was reapproximated using interrupted 3-0 vicryl and the skin edges reapproximated using a running 40 monocryl suture then sealed using dermabond. Attention was then directed to the right axilla where the area of increased signal was identified and infiltrated using local anesthetic.  The skin was incised and the signal was followed to the sentinel lymph node.  Afferent and efferent lymphatics were clipped and sealed using ligasure device.  The ex vivo count ws 19 with a final background count of 1.  The wound was irrigated and verified to be  hemastatic then closed in layers of 3-0 vicryl suture.  The skin edges reapproximated using 4-0 vicryl and sealed using dermabond. At the end of the case the sponge and needle counts were correct x 2.  I was present and scrubbed for the entire procedure.

## 2015-06-03 NOTE — Transfer of Care (Signed)
Immediate Anesthesia Transfer of Care Note  Patient: Lauren Mccoy  Procedure(s) Performed: Procedure(s): WIDE LOCAL RIGHT FOREARM EXCISION MELANOMA WITH SENTINEL LYMPH NODE MAPPING BIOPSY (Right)  Patient Location: PACU  Anesthesia Type:GA combined with regional for post-op pain  Level of Consciousness: sedated  Airway & Oxygen Therapy: Patient Spontanous Breathing and Patient connected to face mask oxygen  Post-op Assessment: Report given to RN and Post -op Vital signs reviewed and stable  Post vital signs: Reviewed and stable  Last Vitals:  Filed Vitals:   06/03/15 1339 06/03/15 1340  BP:    Pulse: 77 74  Temp:    Resp: 19 13    Complications: No apparent anesthesia complications

## 2015-06-03 NOTE — H&P (Signed)
H&P  Patient Care Team: [name], MD as PCP - General (Family Medicine)  CHIEF COMPLAINTS/PURPOSE OF CONSULTATION: Newly diagnosed malignant melanoma right forearm  HISTORY OF PRESENTING ILLNESS:  Lauren Mccoy is a very pleasant 61 year old lady who reports seeing her primary care physician in July of last year with an abnormal mole of the right mid third dorsal forearm. This was placed in surveillance and she reported that in the Fall and began to change where she describes it coming up to a head and spontaneously drained a small amount of fluid and became thicker. After the under holidays she return for further evaluation and a biopsy was performed with the diagnosis of malignant melanoma. She was referred here for further evaluation and treatment. She denies any history of abnormal mole or abnormal syndrome. She denies any prior history of malignant melanoma. She denies any history of blistering sunburns.   I reviewed her records extensively and collaborated the history with the patient.  SUMMARY OF ONCOLOGIC HISTORY:  Newly diagnosed right mid third dorsal forearm malignant melanoma     MEDICAL HISTORY:  Systemic lupus. Sjgren syndrome. Hypothyroidism. Anxiety. Newly diagnosed malignant melanoma right forearm.   SURGICAL HISTORY: Tonsillectomy in her 35s. Tubal ligation. Diagnostic laparoscopy for recurrent urinary tract infections. Excisional breast biopsies 2 with benign diagnoses. Prior mole excisions all of which were benign.  SOCIAL HISTORY: Social History   Social History  . Marital Status: Married       . Number of Children: 2  .     Occupational History  . Not on file.   Social History Main Topics  . Smoking status: Quit in 2012   .    Marland Kitchen Alcohol Use: None   .    Marland Kitchen     Other Topics Concern  . Not on file   Social History Narrative  .     FAMILY HISTORY: Maternal grandmother with a  history of breast cancer deceased at age 38. He has a daughter with a history of myasthenia gravis successfully treated with thymectomy. Her father's deceased with a diagnosis of cancer she is unsure what kind but thinks she may have heard it had something to do with melanoma. Son who is alive with a history of hypothyroidism. Her daughter who had myasthenia gravis also had multiple moles and a lesion that was described as pre-melanotic excised.  ALLERGIES: has No Known Allergies.  MEDICATIONS:  Current Outpatient Prescriptions  Medication Sig Dispense Refill  . ALPRAZolam (XANAX) 0.5 MG tablet TAKE 1/2 TO 1 TABLET 3 TIMES A DAY AS NEEDED  0  . Cholecalciferol (VITAMIN D) 2000 units CAPS Take 2,000 Units by mouth daily.    . folic acid (FOLVITE) 1 MG tablet Take 1 mg by mouth 2 (two) times daily.    . hydroxychloroquine (PLAQUENIL) 200 MG tablet Take 200 mg by mouth 2 (two) times daily.    Marland Kitchen levothyroxine (SYNTHROID, LEVOTHROID) 100 MCG tablet Take 100 mcg by mouth daily before breakfast.    . magic mouthwash SOLN SWISH 1 TO 2 TEASPOONS BY MOUTH TWICE A DAY  12  . methotrexate (RHEUMATREX) 2.5 MG tablet Take 2.5 mg by mouth once a week. Caution:Chemotherapy. Protect from light.    Marland Kitchen POTASSIUM PO Take 595 mg by mouth daily.    . predniSONE (DELTASONE) 5 MG tablet Take 5 mg by mouth daily.    . Probiotic Product (HEALTHY COLON PO) Take 1 tablet by mouth daily.    . vitamin E 400 UNIT  capsule Take 400 Units by mouth daily.    Marland Kitchen zolpidem (AMBIEN) 5 MG tablet Take 5 mg by mouth at bedtime as needed for sleep.     No current facility-administered medications for this visit.    REVIEW OF SYSTEMS:  Constitutional: Denies fevers, chills or abnormal night sweats Eyes: Denies blurriness of vision, double vision or watery eyes Ears, nose, mouth, throat, and face: Denies mucositis or sore throat Respiratory: Denies cough, dyspnea  or wheezes Cardiovascular: Denies palpitation, chest discomfort or lower extremity swelling Gastrointestinal: Denies nausea, heartburn or change in bowel habits Skin: Denies abnormal skin rashes Lymphatics: Denies new lymphadenopathy or easy bruising Neurological:Denies numbness, tingling or new weaknesses Behavioral/Psych: Mood is stable, no new changes  Breast: Denies any palpable lumps or discharge All other systems were reviewed with the patient and are negative.  PHYSICAL EXAMINATION: ECOG PERFORMANCE STATUS: 0 - Asymptomatic  Filed Vitals:   05/27/15 1249  BP: 139/65  Pulse: 94  Temp: 98.3 F (36.8 C)  Resp: 20   Filed Weights   05/27/15 1249  Weight: 121 lb 12.8 oz (55.248 kg)    GENERAL:alert, no distress and comfortable SKIN: Full skin exam performed with female chaperone in room. Scabbed biopsy site on the dorsal mid-third right forearm with no remaining lesion.( measures 1.0 x 0.7 cm). Otherwise, skin color, texture, turgor are normal, no rashes or significant lesions. EYES: normal, conjunctiva are pink and non-injected, sclera clear OROPHARYNX:no exudate, no erythema and lips, buccal mucosa, and tongue normal  NECK: supple, thyroid normal size, non-tender, without nodularity LYMPH: no palpable lymphadenopathy in the cervical, axillary or inguinal LUNGS: clear to auscultation and percussion with normal breathing effort HEART: regular rate & rhythm and no murmurs and no lower extremity edema ABDOMEN:abdomen soft, non-tender and normal bowel sounds Musculoskeletal:no cyanosis of digits and no clubbing  PSYCH: alert & oriented x 3 with fluent speech NEURO: no focal motor/sensory deficits Lymphatics: No axillary or supraclavicular lymphadenopathy (exam performed in the presence of a chaperone) No inguino-femoral lymphadenopathy.  PATHOLOGY: Skin, right forearm, shave Malignant Melanoma 1.20 mm Clark's level III Ulceration: absent Mitotic  rate greater than or equal to 1/mm squared Regression: absent  Satellitosis: Cannot be identified Pathologic Stage: pT2a  ASSESSMENT AND PLAN:  Patient with a newly diagnosed Right Mid-third dorsal forearm malignant melanoma clinical staged pT2a, N0, M0 (Stage IB).  I had a long discussion with the patient and her husband regarding the diagnosis, stage and treatment recommendations (NCCN). I have recommended a wide local excision with 1.0 cm margins with consideration for sentinel lymph node biopsy. They understand that the resection will require an elongated elliptical incision to allow for primary closure. They also understand that while a sentinel lymph node biopsy is important for staging its impact on his overall survival is unclear. She also understands that due to her use of prednisone and methotrexate that she is at higher risk for wound healing complications. We also discussed the risk of sentinel lymph node biopsy including lymphedema and pain.  She is elected to move forward with a wide local excision of the right dorsal mid forearm malignant melanoma with 1.0 cm margins and sentinel lymph node mapping. We'll be scheduling her for surgery in the near future.   All questions were answered. The patient knows to call the clinic with any problems, questions or concerns.  Frederich Cha, MD

## 2015-06-03 NOTE — Anesthesia Preprocedure Evaluation (Signed)
Anesthesia Evaluation  Patient identified by MRN, date of birth, ID band Patient awake    Reviewed: Allergy & Precautions, NPO status , Patient's Chart, lab work & pertinent test results  History of Anesthesia Complications (+) PONV  Airway Mallampati: I  TM Distance: >3 FB Neck ROM: Full    Dental  (+) Partial Lower, Dental Advisory Given   Pulmonary former smoker,    breath sounds clear to auscultation       Cardiovascular  Rhythm:Regular Rate:Normal     Neuro/Psych    GI/Hepatic   Endo/Other  Hypothyroidism   Renal/GU      Musculoskeletal   Abdominal   Peds  Hematology   Anesthesia Other Findings   Reproductive/Obstetrics                             Anesthesia Physical Anesthesia Plan  ASA: III  Anesthesia Plan: General   Post-op Pain Management: GA combined w/ Regional for post-op pain   Induction: Intravenous  Airway Management Planned: LMA  Additional Equipment:   Intra-op Plan:   Post-operative Plan: Extubation in OR  Informed Consent:   Dental advisory given  Plan Discussed with: CRNA, Anesthesiologist and Surgeon  Anesthesia Plan Comments:         Anesthesia Quick Evaluation

## 2015-06-04 ENCOUNTER — Telehealth: Payer: Self-pay | Admitting: Gynecologic Oncology

## 2015-06-04 ENCOUNTER — Encounter (HOSPITAL_BASED_OUTPATIENT_CLINIC_OR_DEPARTMENT_OTHER): Payer: Self-pay | Admitting: General Surgery

## 2015-06-05 ENCOUNTER — Ambulatory Visit (HOSPITAL_COMMUNITY): Payer: 59

## 2015-06-08 NOTE — Telephone Encounter (Signed)
Called to check on patient's post-op status.  Patient doing well.  Mild soreness reported with incision in the axilla.  Follow up appt arranged with Dr. Rolanda Jay.  Advised to call for any questions or concerns.

## 2015-06-10 ENCOUNTER — Ambulatory Visit (HOSPITAL_BASED_OUTPATIENT_CLINIC_OR_DEPARTMENT_OTHER): Payer: 59 | Admitting: General Surgery

## 2015-06-10 ENCOUNTER — Encounter: Payer: Self-pay | Admitting: General Surgery

## 2015-06-10 VITALS — BP 121/70 | HR 82 | Temp 98.1°F | Wt 121.0 lb

## 2015-06-10 DIAGNOSIS — C4361 Malignant melanoma of right upper limb, including shoulder: Secondary | ICD-10-CM

## 2015-06-10 DIAGNOSIS — Z7189 Other specified counseling: Secondary | ICD-10-CM

## 2015-06-10 NOTE — Progress Notes (Signed)
I had the pleasure of meeting with Lauren Mccoy and her family today in routine postop evaluation status post wide local excision of right forearm malignant melanoma with right axillary sentinel lymph node biopsy. She has no specific complaints and on examination (with a female chaperone in the room) her incisions are clean and dry without erythema, warmth or discharge.  Copies of her operative and pathology notes were provided to her. We reviewed her final pathology, which unfortunately showed metastatic disease to the sentinel lymph node. It was no remaining tumor in the primary site and all margins were clear. This changes her stage to IIIA.  We reviewed in NCCN guidelines and discussed obtaining a pet CT scan to rule out distant metastatic disease as the next step. We then discussed that if the scan was negative the options would be right axillary completion lymphadenectomy versus observation and referral to medical oncology to discuss adjuvant therapy. She understands that even if she has a lymph node dissection we would also refer her on to medical oncology to discuss adjuvant therapy.    We had an extended discussion reviewing the risk and benefits of completion lymphadenectomy including permanent numbness to the upper inner arm, injury to the long thoracic and thoracodorsal nerves and the attendant disability associated with injury to those nerves. We also discussed the potential for lymphedema which could be severe, necessitating lymphedema therapy and compression stockings. She also understands that with her underlying diagnosis of lupus this would require more extended discussion with a medical oncologist as to her eligibility regarding immune therapy.  We will plan to see her back next week after she's completed a pet CT scan and then answer any additional questions. At that point will finalize her decision regarding moving forward with completion no dissection assuming the scan is negative.

## 2015-06-11 ENCOUNTER — Telehealth: Payer: Self-pay | Admitting: *Deleted

## 2015-06-11 ENCOUNTER — Other Ambulatory Visit (HOSPITAL_COMMUNITY): Payer: 59

## 2015-06-11 NOTE — Telephone Encounter (Signed)
Notified patient of future appointments. Pt is scheduled for a PET scan on 06/17/2015 @ 12. Pt was advised to arrive at 11:30 and to have nothing to eat or drink 6 hours prior to her exam. Pt is also scheduled for appointment with Dr. Rolanda Jay on 06/17/15 @ 2:00pm. Pt agreed with time, date and instructions.

## 2015-06-16 ENCOUNTER — Other Ambulatory Visit: Payer: Self-pay | Admitting: Gynecologic Oncology

## 2015-06-16 DIAGNOSIS — C4361 Malignant melanoma of right upper limb, including shoulder: Secondary | ICD-10-CM

## 2015-06-17 ENCOUNTER — Encounter (HOSPITAL_COMMUNITY)
Admission: RE | Admit: 2015-06-17 | Discharge: 2015-06-17 | Disposition: A | Discharge status: Home / Self Care | Payer: 59 | Source: Ambulatory Visit | Attending: General Surgery | Admitting: General Surgery

## 2015-06-17 ENCOUNTER — Encounter: Payer: Self-pay | Admitting: General Surgery

## 2015-06-17 ENCOUNTER — Ambulatory Visit (HOSPITAL_BASED_OUTPATIENT_CLINIC_OR_DEPARTMENT_OTHER): Payer: 59 | Admitting: General Surgery

## 2015-06-17 VITALS — BP 106/58 | HR 78 | Temp 97.5°F | Resp 18 | Wt 118.2 lb

## 2015-06-17 DIAGNOSIS — C439 Malignant melanoma of skin, unspecified: Secondary | ICD-10-CM | POA: Diagnosis not present

## 2015-06-17 DIAGNOSIS — C4361 Malignant melanoma of right upper limb, including shoulder: Secondary | ICD-10-CM

## 2015-06-17 LAB — GLUCOSE, CAPILLARY: GLUCOSE-CAPILLARY: 80 mg/dL (ref 65–99)

## 2015-06-17 MED ORDER — FLUDEOXYGLUCOSE F - 18 (FDG) INJECTION
7.2000 | Freq: Once | INTRAVENOUS | Status: AC | PRN
  Administered 2015-06-17: 7.2 via INTRAVENOUS

## 2015-06-17 NOTE — Progress Notes (Signed)
I had the pleasure of meeting with Lauren Mccoy and her family to review the results of her PET CT scan. The report showed no evidence of metastatic disease. There were liver lesions or unchanged since 2011. And possible gallstones. She is comfortable moving forward with a right axillary lymphadenectomy. All questions were answered. She and her family need to look at their schedule and report of coming travel before finalizing of treatment date. Risk and benefits were reviewed once again and she understands that she will need a drain in the postoperative setting will likely stay overnight.

## 2015-06-17 NOTE — Patient Instructions (Signed)
Please call [phone] and speak with Melissa to schedule your lymph node excision with Dr. Rolanda Jay.

## 2015-06-19 ENCOUNTER — Telehealth: Payer: Self-pay | Admitting: Gynecologic Oncology

## 2015-06-19 ENCOUNTER — Ambulatory Visit (HOSPITAL_COMMUNITY): Payer: 59

## 2015-06-19 ENCOUNTER — Encounter (HOSPITAL_COMMUNITY): Payer: 59

## 2015-06-19 NOTE — Telephone Encounter (Signed)
Returned call to patient.  Patient had left message stating she was ready to have her surgery scheduled.  Left message for her to please call the office to decide on a date that would work best for her since she is going out of town.

## 2015-07-11 ENCOUNTER — Encounter (HOSPITAL_BASED_OUTPATIENT_CLINIC_OR_DEPARTMENT_OTHER): Payer: Self-pay | Admitting: *Deleted

## 2015-07-14 ENCOUNTER — Encounter (HOSPITAL_BASED_OUTPATIENT_CLINIC_OR_DEPARTMENT_OTHER): Payer: Self-pay | Admitting: *Deleted

## 2015-07-14 NOTE — Progress Notes (Signed)
NPO AFTER MN.  ARRIVE AT 0800.  NEEDS HG.  WILL TAKE ZANTAC AND SYNTHROID AM DOS W/ SIPS OF WATER.

## 2015-07-18 ENCOUNTER — Telehealth: Payer: Self-pay | Admitting: Gynecologic Oncology

## 2015-07-18 NOTE — Telephone Encounter (Signed)
Called to check in with patient prior to her procedure on Monday.  Left a message for her stating she could call the office for any questions or concerns.

## 2015-07-21 ENCOUNTER — Encounter (HOSPITAL_BASED_OUTPATIENT_CLINIC_OR_DEPARTMENT_OTHER): Payer: Self-pay | Admitting: *Deleted

## 2015-07-21 ENCOUNTER — Ambulatory Visit (HOSPITAL_BASED_OUTPATIENT_CLINIC_OR_DEPARTMENT_OTHER): Payer: 59 | Admitting: Anesthesiology

## 2015-07-21 ENCOUNTER — Observation Stay (HOSPITAL_BASED_OUTPATIENT_CLINIC_OR_DEPARTMENT_OTHER)
Admission: RE | Admit: 2015-07-21 | Discharge: 2015-07-22 | Disposition: A | Discharge status: Home / Self Care | Payer: 59 | Source: Ambulatory Visit | Attending: General Surgery | Admitting: General Surgery

## 2015-07-21 ENCOUNTER — Encounter (HOSPITAL_COMMUNITY)
Admission: RE | Disposition: A | Discharge status: Home / Self Care | Payer: Self-pay | Source: Ambulatory Visit | Attending: General Surgery

## 2015-07-21 DIAGNOSIS — E039 Hypothyroidism, unspecified: Secondary | ICD-10-CM | POA: Insufficient documentation

## 2015-07-21 DIAGNOSIS — C4361 Malignant melanoma of right upper limb, including shoulder: Secondary | ICD-10-CM | POA: Diagnosis present

## 2015-07-21 DIAGNOSIS — M329 Systemic lupus erythematosus, unspecified: Secondary | ICD-10-CM | POA: Diagnosis not present

## 2015-07-21 DIAGNOSIS — C773 Secondary and unspecified malignant neoplasm of axilla and upper limb lymph nodes: Principal | ICD-10-CM | POA: Insufficient documentation

## 2015-07-21 DIAGNOSIS — K219 Gastro-esophageal reflux disease without esophagitis: Secondary | ICD-10-CM

## 2015-07-21 DIAGNOSIS — Z85820 Personal history of malignant melanoma of skin: Secondary | ICD-10-CM | POA: Insufficient documentation

## 2015-07-21 DIAGNOSIS — Z87891 Personal history of nicotine dependence: Secondary | ICD-10-CM | POA: Insufficient documentation

## 2015-07-21 HISTORY — PX: AXILLARY LYMPH NODE BIOPSY: SHX5737

## 2015-07-21 HISTORY — DX: Presence of spectacles and contact lenses: Z97.3

## 2015-07-21 HISTORY — DX: Sjogren syndrome, unspecified: M35.00

## 2015-07-21 HISTORY — DX: Melanoma in situ of right upper limb, including shoulder: D03.61

## 2015-07-21 HISTORY — DX: Presence of dental prosthetic device (complete) (partial): Z97.2

## 2015-07-21 HISTORY — DX: Systemic lupus erythematosus, unspecified: M32.9

## 2015-07-21 LAB — POCT HEMOGLOBIN-HEMACUE: Hemoglobin: 13.8 g/dL (ref 12.0–15.0)

## 2015-07-21 SURGERY — AXILLARY LYMPH NODE BIOPSY
Anesthesia: General | Site: Axilla | Laterality: Right

## 2015-07-21 MED ORDER — CALCIUM CARBONATE ANTACID 500 MG PO CHEW
1.0000 | CHEWABLE_TABLET | Freq: Four times a day (QID) | ORAL | Status: DC | PRN
  Administered 2015-07-21: 200 mg via ORAL
  Filled 2015-07-21: qty 1

## 2015-07-21 MED ORDER — ONDANSETRON HCL 4 MG/2ML IJ SOLN
4.0000 mg | Freq: Once | INTRAMUSCULAR | Status: DC | PRN
  Filled 2015-07-21: qty 2

## 2015-07-21 MED ORDER — ONDANSETRON HCL 4 MG/2ML IJ SOLN
INTRAMUSCULAR | Status: AC
Start: 2015-07-21 — End: 2015-07-21
  Filled 2015-07-21: qty 2

## 2015-07-21 MED ORDER — MAGIC MOUTHWASH
5.0000 mL | Freq: Three times a day (TID) | ORAL | Status: DC | PRN
  Filled 2015-07-21: qty 5

## 2015-07-21 MED ORDER — PROPOFOL 10 MG/ML IV BOLUS
INTRAVENOUS | Status: DC | PRN
  Administered 2015-07-21: 170 mg via INTRAVENOUS

## 2015-07-21 MED ORDER — PROPOFOL 500 MG/50ML IV EMUL
INTRAVENOUS | Status: DC | PRN
  Administered 2015-07-21: 75 ug/kg/min via INTRAVENOUS

## 2015-07-21 MED ORDER — EPHEDRINE SULFATE 50 MG/ML IJ SOLN
INTRAMUSCULAR | Status: AC
  Filled 2015-07-21: qty 1

## 2015-07-21 MED ORDER — POTASSIUM GLUCONATE 595 (99 K) MG PO TABS
595.0000 mg | ORAL_TABLET | Freq: Every day | ORAL | Status: DC
  Administered 2015-07-21 – 2015-07-22 (×2): 595 mg via ORAL
  Filled 2015-07-21 (×2): qty 1

## 2015-07-21 MED ORDER — ONDANSETRON HCL 4 MG/2ML IJ SOLN
INTRAMUSCULAR | Status: DC | PRN
  Administered 2015-07-21: 4 mg via INTRAVENOUS

## 2015-07-21 MED ORDER — MIDAZOLAM HCL 5 MG/5ML IJ SOLN
INTRAMUSCULAR | Status: DC | PRN
  Administered 2015-07-21: 2 mg via INTRAVENOUS

## 2015-07-21 MED ORDER — FENTANYL CITRATE (PF) 100 MCG/2ML IJ SOLN
INTRAMUSCULAR | Status: DC | PRN
  Administered 2015-07-21 (×2): 25 ug via INTRAVENOUS
  Administered 2015-07-21: 50 ug via INTRAVENOUS

## 2015-07-21 MED ORDER — PROPOFOL 10 MG/ML IV BOLUS
INTRAVENOUS | Status: AC
  Filled 2015-07-21: qty 20

## 2015-07-21 MED ORDER — SODIUM CHLORIDE 0.9 % IV SOLN
8.0000 mg | Freq: Three times a day (TID) | INTRAVENOUS | Status: DC
  Administered 2015-07-21 – 2015-07-22 (×2): 8 mg via INTRAVENOUS
  Filled 2015-07-21 (×6): qty 4

## 2015-07-21 MED ORDER — DEXAMETHASONE SODIUM PHOSPHATE 10 MG/ML IJ SOLN
INTRAMUSCULAR | Status: AC
  Filled 2015-07-21: qty 1

## 2015-07-21 MED ORDER — CEFAZOLIN SODIUM-DEXTROSE 2-4 GM/100ML-% IV SOLN
2.0000 g | Freq: Three times a day (TID) | INTRAVENOUS | Status: AC
  Administered 2015-07-21: 2 g via INTRAVENOUS
  Filled 2015-07-21: qty 100

## 2015-07-21 MED ORDER — SCOPOLAMINE 1 MG/3DAYS TD PT72
1.0000 | MEDICATED_PATCH | TRANSDERMAL | Status: DC
  Administered 2015-07-21: 1.5 mg via TRANSDERMAL
  Filled 2015-07-21: qty 1

## 2015-07-21 MED ORDER — FENTANYL CITRATE (PF) 100 MCG/2ML IJ SOLN
25.0000 ug | INTRAMUSCULAR | Status: DC | PRN
  Administered 2015-07-21: 50 ug via INTRAVENOUS
  Administered 2015-07-21 (×2): 25 ug via INTRAVENOUS
  Filled 2015-07-21: qty 1

## 2015-07-21 MED ORDER — LACTATED RINGERS IV SOLN
INTRAVENOUS | Status: DC
  Administered 2015-07-21 (×2): via INTRAVENOUS
  Filled 2015-07-21: qty 1000

## 2015-07-21 MED ORDER — SCOPOLAMINE 1 MG/3DAYS TD PT72
MEDICATED_PATCH | TRANSDERMAL | Status: AC
  Filled 2015-07-21: qty 1

## 2015-07-21 MED ORDER — HYDROXYCHLOROQUINE SULFATE 200 MG PO TABS
200.0000 mg | ORAL_TABLET | Freq: Two times a day (BID) | ORAL | Status: DC
  Administered 2015-07-21 – 2015-07-22 (×2): 200 mg via ORAL
  Filled 2015-07-21 (×3): qty 1

## 2015-07-21 MED ORDER — VITAMIN E 180 MG (400 UNIT) PO CAPS
400.0000 [IU] | ORAL_CAPSULE | Freq: Every day | ORAL | Status: DC
  Administered 2015-07-21 – 2015-07-22 (×2): 400 [IU] via ORAL
  Filled 2015-07-21 (×2): qty 1

## 2015-07-21 MED ORDER — FENTANYL CITRATE (PF) 100 MCG/2ML IJ SOLN
INTRAMUSCULAR | Status: AC
  Filled 2015-07-21: qty 2

## 2015-07-21 MED ORDER — VITAMIN D3 25 MCG (1000 UNIT) PO TABS
2000.0000 [IU] | ORAL_TABLET | Freq: Every day | ORAL | Status: DC
  Administered 2015-07-21 – 2015-07-22 (×2): 2000 [IU] via ORAL
  Filled 2015-07-21 (×2): qty 2

## 2015-07-21 MED ORDER — FAMOTIDINE 20 MG PO TABS
20.0000 mg | ORAL_TABLET | Freq: Two times a day (BID) | ORAL | Status: DC
  Administered 2015-07-21 – 2015-07-22 (×2): 20 mg via ORAL
  Filled 2015-07-21 (×3): qty 1

## 2015-07-21 MED ORDER — BUPIVACAINE-EPINEPHRINE 0.25% -1:200000 IJ SOLN
INTRAMUSCULAR | Status: DC | PRN
  Administered 2015-07-21: 8 mL

## 2015-07-21 MED ORDER — 0.9 % SODIUM CHLORIDE (POUR BTL) OPTIME
TOPICAL | Status: DC | PRN
  Administered 2015-07-21: 500 mL

## 2015-07-21 MED ORDER — CEFAZOLIN SODIUM-DEXTROSE 2-4 GM/100ML-% IV SOLN
2.0000 g | INTRAVENOUS | Status: AC
  Administered 2015-07-21: 2 g via INTRAVENOUS
  Filled 2015-07-21: qty 100

## 2015-07-21 MED ORDER — METHOTREXATE 2.5 MG PO TABS: 22.5000 mg | ORAL_TABLET | ORAL | Status: DC

## 2015-07-21 MED ORDER — LEVOTHYROXINE SODIUM 100 MCG PO TABS
100.0000 ug | ORAL_TABLET | Freq: Every day | ORAL | Status: DC
  Administered 2015-07-22: 100 ug via ORAL
  Filled 2015-07-21 (×2): qty 1

## 2015-07-21 MED ORDER — ZOLPIDEM TARTRATE 5 MG PO TABS: 5.0000 mg | ORAL_TABLET | Freq: Every evening | ORAL | Status: DC | PRN

## 2015-07-21 MED ORDER — DOCUSATE SODIUM 100 MG PO CAPS
100.0000 mg | ORAL_CAPSULE | Freq: Every day | ORAL | Status: DC
  Administered 2015-07-22: 100 mg via ORAL
  Filled 2015-07-21: qty 1

## 2015-07-21 MED ORDER — MIDAZOLAM HCL 2 MG/2ML IJ SOLN
INTRAMUSCULAR | Status: AC
  Filled 2015-07-21: qty 2

## 2015-07-21 MED ORDER — POTASSIUM 75 MG PO TABS: 595.0000 mg | ORAL_TABLET | Freq: Every day | ORAL | Status: DC

## 2015-07-21 MED ORDER — FOLIC ACID 1 MG PO TABS
2.0000 mg | ORAL_TABLET | Freq: Every day | ORAL | Status: DC
  Administered 2015-07-21 – 2015-07-22 (×2): 2 mg via ORAL
  Filled 2015-07-21 (×2): qty 2

## 2015-07-21 MED ORDER — LIDOCAINE HCL (CARDIAC) 20 MG/ML IV SOLN
INTRAVENOUS | Status: AC
  Filled 2015-07-21: qty 5

## 2015-07-21 MED ORDER — DEXAMETHASONE SODIUM PHOSPHATE 4 MG/ML IJ SOLN
INTRAMUSCULAR | Status: DC | PRN
  Administered 2015-07-21: 10 mg via INTRAVENOUS

## 2015-07-21 MED ORDER — PROPOFOL 10 MG/ML IV BOLUS
INTRAVENOUS | Status: AC
  Filled 2015-07-21: qty 40

## 2015-07-21 MED ORDER — CEFAZOLIN SODIUM-DEXTROSE 2-4 GM/100ML-% IV SOLN
INTRAVENOUS | Status: AC
  Filled 2015-07-21: qty 100

## 2015-07-21 MED ORDER — OXYCODONE-ACETAMINOPHEN 5-325 MG PO TABS
2.0000 | ORAL_TABLET | Freq: Four times a day (QID) | ORAL | Status: DC | PRN
  Administered 2015-07-21 – 2015-07-22 (×2): 2 via ORAL
  Filled 2015-07-21 (×2): qty 2

## 2015-07-21 MED ORDER — LIDOCAINE HCL (CARDIAC) 20 MG/ML IV SOLN
INTRAVENOUS | Status: DC | PRN
  Administered 2015-07-21: 60 mg via INTRAVENOUS

## 2015-07-21 MED ORDER — MORPHINE SULFATE (PF) 2 MG/ML IV SOLN: 2.0000 mg | INTRAVENOUS | Status: DC | PRN

## 2015-07-21 MED ORDER — EPHEDRINE SULFATE 50 MG/ML IJ SOLN
INTRAMUSCULAR | Status: DC | PRN
  Administered 2015-07-21: 10 mg via INTRAVENOUS
  Administered 2015-07-21: 5 mg via INTRAVENOUS

## 2015-07-21 MED ORDER — PREDNISONE 5 MG PO TABS
5.0000 mg | ORAL_TABLET | Freq: Every morning | ORAL | Status: DC
  Administered 2015-07-22: 5 mg via ORAL
  Filled 2015-07-21 (×2): qty 1

## 2015-07-21 MED ORDER — ALPRAZOLAM 0.5 MG PO TABS
0.5000 mg | ORAL_TABLET | Freq: Two times a day (BID) | ORAL | Status: DC | PRN
  Administered 2015-07-21: 0.5 mg via ORAL
  Filled 2015-07-21: qty 1

## 2015-07-21 SURGICAL SUPPLY — 58 items
APPLIER CLIP 9.375 MED OPEN (MISCELLANEOUS) ×3
BANDAGE ACE 6X5 VEL STRL LF (GAUZE/BANDAGES/DRESSINGS) IMPLANT
BLADE CLIPPER SURG (BLADE) IMPLANT
BLADE SURG 10 STRL SS (BLADE) IMPLANT
BLADE SURG 15 STRL LF DISP TIS (BLADE) ×1 IMPLANT
BLADE SURG 15 STRL SS (BLADE) ×2
BNDG COHESIVE 4X5 TAN STRL (GAUZE/BANDAGES/DRESSINGS) ×3 IMPLANT
CANISTER SUCT 1200ML W/VALVE (MISCELLANEOUS) IMPLANT
CATH ROBINSON RED A/P 8FR (CATHETERS) ×3 IMPLANT
CHLORAPREP W/TINT 26ML (MISCELLANEOUS) ×3 IMPLANT
CLIP APPLIE 9.375 MED OPEN (MISCELLANEOUS) ×1 IMPLANT
CLIP TI MEDIUM 6 (CLIP) ×3 IMPLANT
COVER BACK TABLE 60X90IN (DRAPES) ×3 IMPLANT
COVER MAYO STAND STRL (DRAPES) ×3 IMPLANT
DECANTER SPIKE VIAL GLASS SM (MISCELLANEOUS) IMPLANT
DERMABOND ADVANCED (GAUZE/BANDAGES/DRESSINGS)
DERMABOND ADVANCED .7 DNX12 (GAUZE/BANDAGES/DRESSINGS) IMPLANT
DISSECTOR ROUND CHERRY 3/8 STR (MISCELLANEOUS) ×3 IMPLANT
DRAIN CHANNEL 19F RND (DRAIN) ×3 IMPLANT
DRAPE LAPAROTOMY 100X72 PEDS (DRAPES) IMPLANT
DRAPE LG THREE QUARTER DISP (DRAPES) ×6 IMPLANT
DRAPE ORTHO SPLIT 77X108 STRL (DRAPES) ×4
DRAPE SURG ORHT 6 SPLT 77X108 (DRAPES) ×2 IMPLANT
DRAPE U 20/CS (DRAPES) IMPLANT
DRAPE U-SHAPE 76X120 STRL (DRAPES) IMPLANT
DRAPE UTILITY XL STRL (DRAPES) ×3 IMPLANT
DRSG TEGADERM 4X4.75 (GAUZE/BANDAGES/DRESSINGS) IMPLANT
ELECT NEEDLE BLADE 2-5/6 (NEEDLE) ×3 IMPLANT
ELECT REM PT RETURN 9FT ADLT (ELECTROSURGICAL) ×3
ELECTRODE REM PT RTRN 9FT ADLT (ELECTROSURGICAL) ×1 IMPLANT
EVACUATOR SILICONE 100CC (DRAIN) ×3 IMPLANT
GLOVE BIO SURGEON STRL SZ7.5 (GLOVE) ×3 IMPLANT
GLOVE BIOGEL PI IND STRL 8 (GLOVE) ×1 IMPLANT
GLOVE BIOGEL PI INDICATOR 8 (GLOVE) ×2
GOWN STRL REUS W/ TWL LRG LVL3 (GOWN DISPOSABLE) ×2 IMPLANT
GOWN STRL REUS W/TWL LRG LVL3 (GOWN DISPOSABLE) ×4
LIGASURE 7.4 SM JAW OPEN (ELECTROSURGICAL) ×3 IMPLANT
LIGASURE CURVED, SMALL JAW, OPEN SEALER/DIVIDER ×3 IMPLANT
LIQUID BAND (GAUZE/BANDAGES/DRESSINGS) ×3 IMPLANT
NDL SAFETY ECLIPSE 18X1.5 (NEEDLE) IMPLANT
NEEDLE HYPO 18GX1.5 SHARP (NEEDLE)
NEEDLE HYPO 25X1 1.5 SAFETY (NEEDLE) ×3 IMPLANT
NS IRRIG 1000ML POUR BTL (IV SOLUTION) ×3 IMPLANT
PACK BASIN DAY SURGERY FS (CUSTOM PROCEDURE TRAY) ×3 IMPLANT
PENCIL BUTTON HOLSTER BLD 10FT (ELECTRODE) ×3 IMPLANT
SLEEVE SCD COMPRESS KNEE MED (MISCELLANEOUS) ×3 IMPLANT
SPONGE GAUZE 4X4 12PLY STER LF (GAUZE/BANDAGES/DRESSINGS) IMPLANT
SPONGE LAP 4X18 X RAY DECT (DISPOSABLE) ×6 IMPLANT
STOCKINETTE IMPERVIOUS LG (DRAPES) ×3 IMPLANT
SUT ETHILON 3 0 PS 1 (SUTURE) ×3 IMPLANT
SUT MON AB 4-0 PC3 18 (SUTURE) ×3 IMPLANT
SUT SILK 2 0 SH (SUTURE) IMPLANT
SUT VICRYL 3-0 CR8 SH (SUTURE) ×3 IMPLANT
SYR 5ML LL (SYRINGE) IMPLANT
SYR CONTROL 10ML LL (SYRINGE) ×3 IMPLANT
TOWEL OR 17X24 6PK STRL BLUE (TOWEL DISPOSABLE) ×6 IMPLANT
TOWEL OR NON WOVEN STRL DISP B (DISPOSABLE) IMPLANT
YANKAUER SUCT BULB TIP NO VENT (SUCTIONS) IMPLANT

## 2015-07-21 NOTE — H&P (View-Only) (Signed)
NPO AFTER MN.  ARRIVE AT 0800.  NEEDS HG.  WILL TAKE ZANTAC AND SYNTHROID AM DOS W/ SIPS OF WATER.

## 2015-07-21 NOTE — Interval H&P Note (Signed)
History and Physical Interval Note:  07/21/2015 8:25 AM  Lauren Mccoy  has presented today for surgery, with the diagnosis of malignant melanoma  The various methods of treatment have been discussed with the patient and family. After consideration of risks, benefits and other options for treatment, the patient has consented to  Procedure(s): RIGHT AXILLARY LYMPHADENECTOMY (N/A) as a surgical intervention .  The patient's history has been reviewed, patient examined, no change in status, stable for surgery.  I have reviewed the patient's chart and labs.  Questions were answered to the patient's satisfaction.     Frederich Cha

## 2015-07-21 NOTE — Op Note (Signed)
   Pre-operative Diagnosis: Right upper extremity malignant melanoma status post wide local excision with sentinel lymph node biopsy. Metastatic disease to right axillary sentinel lymph node.  Procedure: Right complete axillary lymphadenectomy.  Post-operative Diagnosis: Right upper extremity malignant melanoma status post wide local excision with sentinel lymph node biopsy. Metastatic disease to right axillary sentinel lymph node.  Surgeon: Malachi Bonds  Anesthesia:  General with local 1/4% Marcaine with epinephrine.  IVF: 1000 mL crystalloid  EBL: 25 mL  Drains: JP right axilla  Complications: None; patient tolerated the procedure well.   Disposition: PACU - hemodynamically stable.   Condition: stable   Indication for Procedure: Patient is a very pleasant 60 year old lady who was recently diagnosed with a right forearm malignant melanoma. She underwent a wide local excision with sentinel lymph node biopsy. Unfortunately the sentinel lymph node came back with evidence of metastatic disease. She had a negative PET/CT scan for metastatic disease. She is elected to move forward with right axillary complete lymphadenectomy.  Operative Findings: Normal-appearing right axillary tissue. The long thoracic and thoracodorsal nerves were identified and preserved and were functioning at the end of the case.  Procedure in Detail: After informed consent was obtained the patient was identified taken to the operating room and placed in supine position. After adequate anesthesia was achieved she was prepped and draped in standard sterile fashion. Timeout was performed in the right axilla was infiltrated using local anesthetic and a curvilinear incision was made incorporating the prior incision into the primary incision. Dissection was carried down into the axillary fat. The lateral border of the latissimus dorsi muscle was identified and the fascia incised tracing this cephalad to the  axillary vein. This was then traced medially identifying venous and lymphatic branches that were sealed using LigaSure device or surgical clips. Attention was then directed to the chest wall where the axillary fat was swept inferiorly identifying the long thoracic nerve which was identified and preserved along its length. The thoracodorsal neurovascular bundle was identified dissected clear and the lymphatic tissue was swept inferiorly to completely deliver it from the latissimus and chest wall. This was passed off the field as right axillary contents. The thoracodorsal neurovascular bundle was inspected and verified to be intact with the nerve functioning. The long thoracic nerve was identified and verified to be intact and functioning. The wound was irrigated with copious amounts of irrigation and verified to be hemostatic. A JP drain was brought out inferiorly and sutured in place using a 3-0 nylon suture. The wound was inspected for any retained sponges or instruments, and none were identified. The wound was then closed using interrupted 3-0 Vicryl suture followed by running 4-0 Monocryl subcuticular closure and sealed using Dermabond. At the end of the case sponge and needle counts were reported as correct 2. I was present scrubbed for the entire procedure.

## 2015-07-21 NOTE — Transfer of Care (Signed)
Immediate Anesthesia Transfer of Care Note  Patient: Lauren Mccoy  Procedure(s) Performed: Procedure(s) (LRB): RIGHT AXILLARY LYMPHADENECTOMY (Right)  Patient Location: PACU  Anesthesia Type: General  Level of Consciousness: awake, oriented, sedated and patient cooperative  Airway & Oxygen Therapy: Patient Spontanous Breathing and Patient connected to face mask oxygen  Post-op Assessment: Report given to PACU RN and Post -op Vital signs reviewed and stable  Post vital signs: Reviewed and stable  Complications: No apparent anesthesia complications

## 2015-07-21 NOTE — H&P (Signed)
No significant changes.  No complaints. Gen:  Alert in no acute distress Lungs: CTA CVS: RRR A:  Right Arm Melanoma S/P resection and positive SLN from Right Axilla.  Negative metastatic work-up.   P:  Patient is ready to move forward with Right Complete Axillary Dissection.

## 2015-07-21 NOTE — Anesthesia Procedure Notes (Signed)
Procedure Name: LMA Insertion Date/Time: 07/21/2015 9:22 AM Performed by: Denna Haggard D Pre-anesthesia Checklist: Patient identified, Emergency Drugs available, Suction available and Patient being monitored Patient Re-evaluated:Patient Re-evaluated prior to inductionOxygen Delivery Method: Circle System Utilized Preoxygenation: Pre-oxygenation with 100% oxygen Intubation Type: IV induction Ventilation: Mask ventilation without difficulty LMA: LMA inserted LMA Size: 4.0 Number of attempts: 1 Airway Equipment and Method: Bite block Placement Confirmation: positive ETCO2 Tube secured with: Tape Dental Injury: Teeth and Oropharynx as per pre-operative assessment

## 2015-07-21 NOTE — Brief Op Note (Signed)
07/21/2015  11:21 AM  PATIENT:  Lauren Mccoy  60 y.o. female  PRE-OPERATIVE DIAGNOSIS:  Malignant melanoma right upper extremity status post wide local excision with right axillary sentinel lymph node biopsy. Metastatic disease to right axillary sentinel lymph node  POST-OPERATIVE DIAGNOSIS:  Malignant melanoma right upper extremity status post wide local excision with right axillary sentinel lymph node biopsy. Metastatic disease to right axillary sentinel lymph node  PROCEDURE:  RIGHT AXILLARY COMPLETE LYMPHADENECTOMY (Right)  SURGEON:  T. Tonye Becket, MD -   ANESTHESIA:  Gen. anesthesia with 0.25% Marcaine with epinephrine local  EBL:  Total I/O In: 1000 [I.V.:1000] Out: 25 [Blood:25]    DRAINS: Jackson-Pratt drain(s) with closed bulb suction in the right axilla   SPECIMEN:  Right axillary contents sent to pathology for permanent.    COUNTS:  YES  TOURNIQUET:  * No tourniquets in log *  DICTATION: .Dragon Dictation  PLAN OF CARE: Admit for overnight observation  PATIENT DISPOSITION:  PACU - hemodynamically stable.   Delay start of Pharmacological VTE agent (>24hrs) due to surgical blood loss or risk of bleeding: not applicable

## 2015-07-21 NOTE — Anesthesia Preprocedure Evaluation (Signed)
Anesthesia Evaluation  Patient identified by MRN, date of birth, ID band Patient awake    Reviewed: Allergy & Precautions, NPO status , Patient's Chart, lab work & pertinent test results  History of Anesthesia Complications (+) PONV and history of anesthetic complications  Airway Mallampati: II  TM Distance: >3 FB Neck ROM: Full    Dental no notable dental hx. (+) Dental Advisory Given   Pulmonary former smoker,    Pulmonary exam normal breath sounds clear to auscultation       Cardiovascular negative cardio ROS Normal cardiovascular exam Rhythm:Regular Rate:Normal     Neuro/Psych  Headaches, PSYCHIATRIC DISORDERS Anxiety    GI/Hepatic negative GI ROS, Neg liver ROS,   Endo/Other  Hypothyroidism   Renal/GU negative Renal ROS  negative genitourinary   Musculoskeletal  (+) Arthritis ,   Abdominal   Peds negative pediatric ROS (+)  Hematology negative hematology ROS (+)   Anesthesia Other Findings Lupus  Reproductive/Obstetrics negative OB ROS                             Anesthesia Physical Anesthesia Plan  ASA: III  Anesthesia Plan: General   Post-op Pain Management:    Induction: Intravenous  Airway Management Planned: LMA  Additional Equipment:   Intra-op Plan:   Post-operative Plan: Extubation in OR  Informed Consent: I have reviewed the patients History and Physical, chart, labs and discussed the procedure including the risks, benefits and alternatives for the proposed anesthesia with the patient or authorized representative who has indicated his/her understanding and acceptance.   Dental advisory given  Plan Discussed with: CRNA  Anesthesia Plan Comments:         Anesthesia Quick Evaluation

## 2015-07-21 NOTE — Anesthesia Postprocedure Evaluation (Signed)
Anesthesia Post Note  Patient: Lauren Mccoy  Procedure(s) Performed: Procedure(s) (LRB): RIGHT AXILLARY LYMPHADENECTOMY (Right)  Patient location during evaluation: PACU Anesthesia Type: General Level of consciousness: awake and alert Pain management: pain level controlled Vital Signs Assessment: post-procedure vital signs reviewed and stable Respiratory status: spontaneous breathing, nonlabored ventilation, respiratory function stable and patient connected to nasal cannula oxygen Cardiovascular status: blood pressure returned to baseline and stable Postop Assessment: no signs of nausea or vomiting Anesthetic complications: no    Last Vitals:  Filed Vitals:   07/21/15 1122 07/21/15 1130  BP: 116/65 129/60  Pulse: 80 76  Temp: 36.8 C   Resp: 15 15    Last Pain:  Filed Vitals:   07/21/15 1134  PainSc: 5                  Anginette Espejo JENNETTE

## 2015-07-22 ENCOUNTER — Encounter (HOSPITAL_BASED_OUTPATIENT_CLINIC_OR_DEPARTMENT_OTHER): Payer: Self-pay | Admitting: General Surgery

## 2015-07-22 DIAGNOSIS — C773 Secondary and unspecified malignant neoplasm of axilla and upper limb lymph nodes: Secondary | ICD-10-CM | POA: Diagnosis not present

## 2015-07-22 MED ORDER — OXYCODONE HCL 5 MG PO TABS
5.0000 mg | ORAL_TABLET | ORAL | Status: DC | PRN
Start: 2015-07-22 — End: 2015-12-16

## 2015-07-22 NOTE — Discharge Instructions (Signed)
07/22/2015  Return to work: 6 weeks if applicable  Activity: 1. Be up and out of the bed during the day.  Take a nap if needed.  You may walk up steps but be careful and use the hand rail.  Stair climbing will tire you more than you think, you may need to stop part way and rest.   2. No lifting or straining for 6 weeks.  3. Do not drive until drain removed.  Do not drive if you are taking narcotic pain medicine.  4. Shower daily.  Use soap and water on your incision and pat dry; don't rub.  No tub baths until cleared by your surgeon.   Diet: 1. Low sodium Heart Healthy Diet is recommended.  2. It is safe to use a laxative, such as Miralax or Colace, if you have difficulty moving your bowels.   Wound Care: 1. Keep clean and dry.  Shower daily.  Reasons to call the Doctor:  Fever - Oral temperature greater than 100.4 degrees Fahrenheit  Foul-smelling vaginal discharge  Difficulty urinating  Nausea and vomiting  Increased pain at the site of the incision that is unrelieved with pain medicine.  Difficulty breathing with or without chest pain  New calf pain especially if only on one side  Sudden, continuing increased vaginal bleeding with or without clots.   Contacts: For questions or concerns you should contact:  Dr. Eddie Dibbles at [phone] or [place], NP at [phone]  After Hours: call [phone] and have Dr. Rolanda Jay paged/contacted  [place] A bulb drain consists of a thin rubber tube and a soft, round bulb that creates a gentle suction. The rubber tube is placed in the area where you had surgery. A bulb is attached to the end of the tube that is outside the body. The bulb drain removes excess fluid that normally builds up in a surgical wound after surgery. The color and amount of fluid will vary. Immediately after surgery, the fluid is bright red and is a little thicker than water. It may gradually change to a yellow or pink  color and become more thin and water-like. When the amount decreases to about 1 or 2 tbsp in 24 hours, your health care provider will usually remove it. DAILY CARE  Keep the bulb flat (compressed) at all times, except while emptying it. The flatness creates suction. You can flatten the bulb by squeezing it firmly in the middle and then closing the cap.  Keep sites where the tube enters the skin dry and covered with a bandage (dressing).  Secure the tube 1-2 in (2.5-5.1 cm) below the insertion sites to keep it from pulling on your stitches. The tube is stitched in place and will not slip out.  Secure the bulb as directed by your health care provider.  For the first 3 days after surgery, there usually is more fluid in the bulb. Empty the bulb whenever it becomes half full because the bulb does not create enough suction if it is too full. The bulb could also overflow. Write down how much fluid you remove each time you empty your drain. Add up the amount removed in 24 hours.  Empty the bulb at the same time every day once the amount of fluid decreases and you only need to empty it once a day. Write down the amounts and the 24-hour totals to give to your health care provider. This helps your health care provider know when the  tubes can be removed. EMPTYING THE BULB DRAIN Before emptying the bulb, get a measuring cup, a piece of paper and a pen, and wash your hands.  Gently run your fingers down the tube (stripping) to empty any drainage from the tubing into the bulb. This may need to be done several times a day to clear the tubing of clots and tissue.  Open the bulb cap to release suction, which causes it to inflate. Do not touch the inside of the cap.  Gently run your fingers down the tube (stripping) to empty any drainage from the tubing into the bulb.  Hold the cap out of the way, and pour fluid into the measuring cup.   Squeeze the bulb to provide suction.  Replace the cap.   Check the  tape that holds the tube to your skin. If it is becoming loose, you can remove the loose piece of tape and apply a new one. Then, pin the bulb to your shirt.   Write down the amount of fluid you emptied out. Write down the date and each time you emptied your bulb drain. (If there are 2 bulbs, note the amount of drainage from each bulb and keep the totals separate. Your health care provider will want to know the total amounts for each drain and which tube is draining more.)   Flush the fluid down the toilet and wash your hands.   Call your health care provider once you have less than 2 tbsp of fluid collecting in the bulb drain every 24 hours. If there is drainage around the tube site, change dressings and keep the area dry. Cleanse around tube with sterile saline and place dry gauze around site. This gauze should be changed when it is soiled. If it stays clean and unsoiled, it should still be changed daily.  SEEK MEDICAL CARE IF:  Your drainage has a bad smell or is cloudy.   You have a fever.   Your drainage is increasing instead of decreasing.   Your tube fell out.   You have redness or swelling around the tube site.   You have drainage from a surgical wound.   Your bulb drain will not stay flat after you empty it.  MAKE SURE YOU:   Understand these instructions.  Will watch your condition.  Will get help right away if you are not doing well or get worse.   This information is not intended to replace advice given to you by your health care provider. Make sure you discuss any questions you have with your health care provider.   Document Released: 02/27/2000 Document Revised: 03/22/2014 Document Reviewed: 09/18/2014 Elsevier Interactive Patient Education [employer].

## 2015-07-22 NOTE — Progress Notes (Signed)
Assessment unchanged. Pt and husband verbalized understanding of dc instructions through teach back included drain care, follow up care and when to call to doctor. Husband verbalized and demonstrated understanding of emptying JP drain. Drain record provided. Discharged via foot per request accompanied by NT and husband to front entrance.

## 2015-07-22 NOTE — Discharge Summary (Signed)
Physician Discharge Summary  Patient ID: Lauren Mccoy MRN: [id number] DOB/AGE: [birth year] 61 y.o.  Admit date: 07/21/2015 Discharge date: 07/22/2015  Admission Diagnoses: Malignant melanoma of right upper arm [facility])  Discharge Diagnoses:  Principal Problem:   Malignant melanoma of right upper arm ([place]) Active Problems:   Melanoma of right upper arm ([place])   Discharged Condition:  The patient is in good condition and stable for discharge.    Hospital Course: On 07/21/2015, the patient underwent the following: Procedure(s): RIGHT AXILLARY LYMPHADENECTOMY.  The postoperative course was uneventful.  She was discharged to home on postoperative day 1 tolerating a regular diet with minimal pain.  Patient discharged home with JP drain in place draining minimal amount of serosanguinous drainage.  Instructions and supplies given including recording sheet for output values.  Consults: None  Significant Diagnostic Studies: None  Treatments: surgery: see above  Discharge Exam: Blood pressure 100/55, pulse 64, temperature 98.8 F (37.1 C), temperature source Oral, resp. rate 14, height 5\' 7"  (1.702 m), weight 119 lb 8 oz (54.205 kg), SpO2 99 %. General appearance: alert, cooperative, appears stated age and no distress Resp: clear to auscultation bilaterally Cardio: regular rate and rhythm, S1, S2 normal, no murmur, click, rub or gallop GI: soft, non-tender; bowel sounds normal; no masses,  no organomegaly Extremities: extremities normal, atraumatic, no cyanosis or edema Incision/Wound: Right axillary incision with dermabond without erythema or drainage.  JP drain charged with minimal amount of serosanguinous drainage.  Disposition: 01-Home or Self Care      Discharge Instructions    Call MD for:  difficulty breathing, headache or visual disturbances    Complete by:  As directed      Call MD for:  extreme fatigue    Complete by:  As directed      Call MD for:  hives    Complete by:  As  directed      Call MD for:  persistant dizziness or light-headedness    Complete by:  As directed      Call MD for:  persistant nausea and vomiting    Complete by:  As directed      Call MD for:  redness, tenderness, or signs of infection (pain, swelling, redness, odor or green/yellow discharge around incision site)    Complete by:  As directed      Call MD for:  severe uncontrolled pain    Complete by:  As directed      Call MD for:  temperature >100.4    Complete by:  As directed      Diet - low sodium heart healthy    Complete by:  As directed      Discharge instructions    Complete by:  As directed   Record JP drain     Driving Restrictions    Complete by:  As directed   No driving until drain removed.  Do not take narcotics and drive.     Increase activity slowly    Complete by:  As directed      Lifting restrictions    Complete by:  As directed   No lifting greater than 10 lbs.            Medication List    TAKE these medications        ALPRAZolam 0.5 MG tablet  Commonly known as:  XANAX  TAKE 1/2 TO 1 TABLET 3 TIMES A DAY AS NEEDED FOR ANXIETY     aspirin-acetaminophen-caffeine 250-250-65 MG  tablet  Commonly known as:  EXCEDRIN MIGRAINE  Take 2 tablets by mouth daily as needed for headache or migraine.     docusate sodium 100 MG capsule  Commonly known as:  COLACE  Take 100 mg by mouth daily.     folic acid 1 MG tablet  Commonly known as:  FOLVITE  Take 2 mg by mouth daily.     HEALTHY COLON PO  Take 1 tablet by mouth daily.     hydroxychloroquine 200 MG tablet  Commonly known as:  PLAQUENIL  Take 200 mg by mouth 2 (two) times daily.     levothyroxine 100 MCG tablet  Commonly known as:  SYNTHROID, LEVOTHROID  Take 100 mcg by mouth daily before breakfast.     magic mouthwash Soln  SWISH 1 TO 2 TEASPOONS BY MOUTH TWICE DAILY AS NEEDED FOR MOUTH SORES     methotrexate 2.5 MG tablet  Commonly known as:  RHEUMATREX  Take 22.5 mg by mouth once a week.  Caution:Chemotherapy. Protect from light.--  Pt takes on friday's     oxyCODONE 5 MG immediate release tablet  Commonly known as:  Oxy IR/ROXICODONE  Take 1-2 tablets (5-10 mg total) by mouth every 4 (four) hours as needed for severe pain.     oxyCODONE-acetaminophen 5-325 MG tablet  Commonly known as:  ROXICET  Take 1 tablet by mouth every 6 (six) hours as needed.     POTASSIUM PO  Take 595 mg by mouth daily.     predniSONE 5 MG tablet  Commonly known as:  DELTASONE  Take 5 mg by mouth every morning.     ranitidine 150 MG tablet  Commonly known as:  ZANTAC  Take 150 mg by mouth 2 (two) times daily as needed for heartburn.     Vitamin D 2000 units Caps  Take 2,000 Units by mouth daily.     vitamin E 400 UNIT capsule  Take 400 Units by mouth daily.     zolpidem 5 MG tablet  Commonly known as:  AMBIEN  Take 2.5-5 mg by mouth at bedtime as needed for sleep.       Follow-up Information    Follow up with Lauren Cha, MD.   Specialty:  General Surgery   Why:  Call with drain output on a daily basis then appt will be made when output less than 30 cc   Contact information:   [address] [place] [address] [id number]       Greater than thirty minutes were spend for face to face discharge instructions and discharge orders/summary in EPIC.   Signed: Cythnia Osmun Mccoy 07/22/2015, 1:34 PM

## 2015-07-24 ENCOUNTER — Telehealth: Payer: Self-pay

## 2015-07-24 NOTE — Telephone Encounter (Signed)
Orders received from [place] to contact the patient with her results from the surgical pathology report, "all nodes are negative" . Patient contacted and updated , patient states understanding ,denies further questions at this time . Patient will call with any changes , questions or concerns.

## 2015-07-28 ENCOUNTER — Telehealth: Payer: Self-pay

## 2015-07-28 NOTE — Telephone Encounter (Signed)
Orders received from [place] to follow up with the patient with Dr Sammuel Hines recommendations. Patient is to decrease daily activity , due to increase amount of stated drainage reported from her right JP drain . Patient contacted and updated with Dr Suzzanne Cloud recommendations , patient states understanding . Patient also informed that she is required to contact us on a daily bases with the amount of drainage she obtains. Patient states she will call us as requested , denies further questions at this time.

## 2015-07-28 NOTE — Telephone Encounter (Signed)
Orders received from [place] to contact the patient to assess her the amount of drainage from her right JP drain. Patient reports drainage on 05/09 50cc , 05/10 76cc , 05/11 69cc , 05/12 60cc , 05/13 79cc , 05/14 63cc and today 05/15 21cc . Melissa Cross, APNP updated , Dr Eddie Dibbles to be updated as well ny Melissa Cross, APNP . Patient was informed she would be contacted with his recommendation for her right JP drain,patient states understanding.

## 2015-07-31 ENCOUNTER — Telehealth: Payer: Self-pay | Admitting: Gynecologic Oncology

## 2015-07-31 NOTE — Telephone Encounter (Signed)
Patient called reporting 38 cc from her JP drain yesterday and from 10:30pm -8:30am only 9 cc of output.  "It has practically stopped."  Dr. Rolanda Jay informed of output.  Appt made for drain removal tomorrow at 1:45pm with Dr. Rolanda Jay.  Patient informed.  No concerns voiced.

## 2015-08-01 ENCOUNTER — Ambulatory Visit (HOSPITAL_BASED_OUTPATIENT_CLINIC_OR_DEPARTMENT_OTHER): Payer: Self-pay | Admitting: General Surgery

## 2015-08-01 VITALS — BP 124/69 | HR 56 | Temp 98.4°F | Resp 17 | Ht 67.0 in | Wt 121.0 lb

## 2015-08-01 DIAGNOSIS — C4361 Malignant melanoma of right upper limb, including shoulder: Secondary | ICD-10-CM

## 2015-08-01 NOTE — Patient Instructions (Addendum)
Please call for the development of any swelling in the right arm or underarm.  You will receive a phone call from the [facility] about a referral with Dr. Alen Blew to discuss immunotherapy.

## 2015-08-01 NOTE — Progress Notes (Signed)
Right axillary complete lymphadenectomy. She does report numbness of the posterior medial aspect of the right upper extremity, which is as expected. She has no complaints of arm swelling. Otherwise she has been reporting her drain output and just recently began draining around the tube. He denies any fever or chills or other complaints. Examination with a female chaperone in the room. She has no obvious edema of the right upper extremity when compared to her left upper extremity. The incision and drain site are clean, dry without erythema, warmth or discharge. Her drain was removed today without difficulty.  We reviewed the results of her pathology   Lymph nodes, regional resection, right axillary contents - TWENTY OF TWENTY LYMPH NODES NEGATIVE FOR METASTATIC MELANOMA (0/20). - CAPSULAR NEVUS, SEE COMMENT. Microscopic Comment Melan-A and Sox-10 were performed on all nodes and there is no evidence of metastatic tumor. In one node there is a small subcapsular area of positivity corresponding with a bland nest of cells (morphologically unlike original tumor), consistent with a capsular nevus. Drs. Depcik-Smith and Teague (dermatopathology) have reviewed the case.  Copies of her operative and pathology reports were provided to her. We reviewed the fact that this keeps her at the same stage. We will be referring her to see Dr. Alen Blew for consideration of adjuvant therapy. And I will plan to see her back one more time for final postop visit and outlining long-term surveillance. They understand and agree with the plan as outlined above. We also reviewed that if she were to have any prolonged drainage or fluid accumulation under her right axilla to contact us immediately.

## 2015-08-04 ENCOUNTER — Telehealth: Payer: Self-pay

## 2015-08-04 NOTE — Telephone Encounter (Signed)
Orders received from Lauren Mccoy to contact Dr Lauren Mccoy patient to assess her right upper extremity after her JP drain was removed . Patient contacted , patient states her right arm is a bit swollen , but it does not feel like fluid ,its slight firm ,but her pain and drainage have decreased .Patient states she is experiencing a decrease in "mobility" , but this was discussed during her post-op visit last week Friday with Dr Lauren Mccoy. Patient's next follow up visit is scheduled for Wednesday August 20, 2015 with Dr Lauren Mccoy , patient denies further questions at this time , will call with any changes , questions or concerns.

## 2015-08-06 ENCOUNTER — Telehealth: Payer: Self-pay | Admitting: Gynecologic Oncology

## 2015-08-06 NOTE — Telephone Encounter (Signed)
Patient called reporting "feeling fat under my arm. Feels like something is in the way preventing me from laying my arm on my side. Wasn't that way Saturday through yesterday." She wants to wait and see if it goes away.  She is coming in tomorrow with her husband and we will plan to see her tomorrow at 11:30.  Advised that she will most likely need the drain replaced but stating "I really don't want that drain back."  Dr. Rolanda Jay made aware of the situation as well.

## 2015-08-07 ENCOUNTER — Other Ambulatory Visit: Payer: Self-pay | Admitting: Gynecologic Oncology

## 2015-08-07 ENCOUNTER — Ambulatory Visit (HOSPITAL_COMMUNITY)
Admission: RE | Admit: 2015-08-07 | Discharge: 2015-08-07 | Disposition: A | Discharge status: Home / Self Care | Payer: 59 | Source: Ambulatory Visit | Attending: Radiology | Admitting: Radiology

## 2015-08-07 ENCOUNTER — Encounter: Payer: Self-pay | Admitting: Gynecologic Oncology

## 2015-08-07 ENCOUNTER — Ambulatory Visit (HOSPITAL_COMMUNITY)
Admission: RE | Admit: 2015-08-07 | Discharge: 2015-08-07 | Disposition: A | Discharge status: Home / Self Care | Payer: 59 | Source: Ambulatory Visit | Attending: Gynecologic Oncology | Admitting: Gynecologic Oncology

## 2015-08-07 DIAGNOSIS — T888XXS Other specified complications of surgical and medical care, not elsewhere classified, sequela: Secondary | ICD-10-CM | POA: Insufficient documentation

## 2015-08-07 DIAGNOSIS — N6489 Other specified disorders of breast: Secondary | ICD-10-CM | POA: Insufficient documentation

## 2015-08-07 DIAGNOSIS — Y838 Other surgical procedures as the cause of abnormal reaction of the patient, or of later complication, without mention of misadventure at the time of the procedure: Secondary | ICD-10-CM | POA: Diagnosis not present

## 2015-08-07 DIAGNOSIS — T888XXA Other specified complications of surgical and medical care, not elsewhere classified, initial encounter: Secondary | ICD-10-CM

## 2015-08-07 DIAGNOSIS — T792XXS Traumatic secondary and recurrent hemorrhage and seroma, sequela: Secondary | ICD-10-CM | POA: Diagnosis present

## 2015-08-07 NOTE — Procedures (Signed)
Technically successful UW guided placed of a 10 Fr drainage catheter placement into the right axillary seroma yielding 50 cc of serous fluid.   All aspirated samples sent to the laboratory for analysis.   EBL: None No immediate post procedural complications.   Ronny Bacon, MD Pager #: [phone]

## 2015-08-07 NOTE — Progress Notes (Signed)
Spoke with the patient about the [place] results.  Patient advised of Dr. Suzzanne Cloud recommendations for drain placement.  She states she would not need sedation.  Patient to [place] the beach and call our office with an update.  She is heading to IR right now for drain placement.

## 2015-08-07 NOTE — Progress Notes (Signed)
Patient presented to the office without an appointment for evaluation of right axillary fluid collection after JP drain removal on Friday, May 19 by Dr. Rolanda Jay.  Patient called yesterday reporting "feeling fat under my arm. Feels like something is in the way preventing me from laying my arm on my side. Wasn't that way Saturday through yesterday."  She states today she feels the area has decreased slightly and she is able to lay her arm closer to her body.  Right axilla evaluated.  Area mildly edematous around the incision and going down laterally from the axilla.  No erythema or increased warmth.  Full range of motion with the arm.  Patient advised to go to radiology for a diagnostic ultrasound to evaluate the fluid collection to see if the area needs aspiration or drain placement.  Verbalizing understanding.

## 2015-08-12 ENCOUNTER — Telehealth: Payer: Self-pay

## 2015-08-12 ENCOUNTER — Ambulatory Visit: Payer: 59 | Admitting: General Surgery

## 2015-08-12 LAB — AEROBIC/ANAEROBIC CULTURE (SURGICAL/DEEP WOUND)
CULTURE: NO GROWTH
SPECIAL REQUESTS: NORMAL

## 2015-08-12 LAB — AEROBIC/ANAEROBIC CULTURE W GRAM STAIN (SURGICAL/DEEP WOUND)

## 2015-08-12 NOTE — Telephone Encounter (Signed)
Orders received from [place] to contact the patient to follow up on the post JP drainage insertion and drainage amount. Patient states the output on the JP drain has been , Fri : 40 cc , Sat: 49 cc , Sun : 30 cc , Mon : 35 cc , did not have chance to empty the drain yet this morning. Patient states she continues to have intermittent pain to the drain site ( right auxiliary ) , states vas was 10/10 took a Percocet and states relief. Patient now states she is experiencing intermittent soreness and tenderness to the JP site with a VAS of 6/10 that is relived with Percocet. Patient also stated she has not heard anything from Dr Hazeline Junker office in regards to her initial consultation for immunotherapy. Contacted Dr Hazeline Junker office [facility]: [phone] spoke with Haskell Riling states they will schedule the appointment and contact the patient with a date and time of consultation. Next appointment with Dr Eddie Dibbles is scheduled for August 20, 2015 , patient denies further questions at this time , will call with any changes , questions or concerns.

## 2015-08-14 ENCOUNTER — Telehealth: Payer: Self-pay

## 2015-08-14 NOTE — Telephone Encounter (Signed)
Patient contacted and updated with appointment being scheduled for tomorrow June 02,2017 at 2 PM with Dr Eddie Dibbles to possibly have the JP drain removed. Patient updated , patient states understanding , denies further questions at this time.

## 2015-08-14 NOTE — Telephone Encounter (Signed)
Incoming call from a patient of Dr Eddie Dibbles , patient calling to report the amount of drainage from her right JP drain . Tuesday 27 cc , Wed 19 cc and todays ouput this AM was 9 cc. Patient states she coming into [facility] tomorrow for a follow up appointment and was wondering if she could come in to have the JP drain removed. Patient informed that we would update Dr Rolanda Jay and contact her with his recommendations . Patient states understanding , denies further questions at this time.

## 2015-08-15 ENCOUNTER — Ambulatory Visit (HOSPITAL_BASED_OUTPATIENT_CLINIC_OR_DEPARTMENT_OTHER): Payer: Self-pay | Admitting: General Surgery

## 2015-08-15 ENCOUNTER — Encounter: Payer: Self-pay | Admitting: General Surgery

## 2015-08-15 VITALS — BP 129/70 | HR 83 | Temp 98.1°F | Resp 18 | Ht 67.0 in | Wt 120.1 lb

## 2015-08-15 DIAGNOSIS — C439 Malignant melanoma of skin, unspecified: Secondary | ICD-10-CM

## 2015-08-15 NOTE — Patient Instructions (Signed)
Plan to follow up with Dr. Rolanda Jay as scheduled.  Limit activity of right arm.

## 2015-08-18 ENCOUNTER — Telehealth: Payer: Self-pay

## 2015-08-18 NOTE — Telephone Encounter (Signed)
Follow up call placed to assess how the patient was doing post JP drain removal on Friday 08/15/2015 . Patient states she doing well , denies pain , however the patient states she does have some "swelling" at night and upon waking in the morning . The patient states that her right axilla site is swollen and decreases throughout the day. Updated Lauren Mccoy, APNP  , she will update Dr Eddie Dibbles. Patient aware of follow up appointment with Dr Rolanda Jay this Wednesday  08/20/2015 , patient denies further questions at this time , will call additional changes or concerns.

## 2015-08-20 ENCOUNTER — Ambulatory Visit (HOSPITAL_BASED_OUTPATIENT_CLINIC_OR_DEPARTMENT_OTHER): Payer: Self-pay | Admitting: General Surgery

## 2015-08-20 ENCOUNTER — Encounter: Payer: Self-pay | Admitting: General Surgery

## 2015-08-20 VITALS — BP 100/87 | HR 91 | Temp 98.1°F | Resp 18 | Ht 67.0 in | Wt 119.4 lb

## 2015-08-20 DIAGNOSIS — C4361 Malignant melanoma of right upper limb, including shoulder: Secondary | ICD-10-CM

## 2015-08-20 NOTE — Assessment & Plan Note (Signed)
The patient is doing well and is now cleared to follow up with Dr. Alen Blew to move forward with discussion regarding adjuvant therapy.  We are placing a referral to physical therapy/lymphedema therapy to improve her range of motion and help with the fluid in her axilla that she experiences in the mornings.  We will await follow-up and treatment recommendations from Dr. Alen Blew before scheduling her for a follow-up appointment with me.

## 2015-08-20 NOTE — Progress Notes (Signed)
I had the pleasure of meeting with Lauren Mccoy in follow-up for a wound check following her recent drain removal. She reports in the morning she has some fullness in the axilla however resolved after getting up and moving around. She denies any fever or chills.  Physical examination performed with a female chaperone in the room.  Her incision is clean, dry without erythema warmth or discharge. Her incision is well-healed. There is no evidence of recurrent fluid collection. She has no lymphedema of the extremity.  Assessment and plan. We will await Dr. Hazeline Junker clinic visit to see if she will be moving forward with adjuvant therapy. She understands I will be happy to see her in long-term follow-up if she is not moving forward with adjuvant therapy. She also understands that she will be following with Dr. Alen Blew during adjuvant therapy and I would be happy to see her in her surveillance. After she completes adjuvant therapy if she so desires.

## 2015-08-20 NOTE — Patient Instructions (Signed)
We will make a referral to the Lymphedema Clinic and you will receive a phone call with that date and time.  Follow up with Dr. Alen Blew and call if you decide to proceed with treatment or would like to follow up with Dr. Rolanda Jay.

## 2015-08-22 ENCOUNTER — Ambulatory Visit (HOSPITAL_BASED_OUTPATIENT_CLINIC_OR_DEPARTMENT_OTHER): Payer: 59 | Admitting: Oncology

## 2015-08-22 VITALS — BP 136/55 | HR 89 | Temp 98.3°F | Resp 18 | Ht 67.0 in | Wt 121.1 lb

## 2015-08-22 DIAGNOSIS — Z87891 Personal history of nicotine dependence: Secondary | ICD-10-CM

## 2015-08-22 DIAGNOSIS — C4361 Malignant melanoma of right upper limb, including shoulder: Secondary | ICD-10-CM | POA: Diagnosis not present

## 2015-08-22 NOTE — Consult Note (Signed)
Reason for Referral: Melanoma.   HPI: 60 year old woman currently of [place] where she lived the majority of her life. She has a history of hypothyroidism, systemic lupus and Sjogren's syndrome. She was found to have an abnormal mole on the right forearm that subsequently underwent biopsy which showed  malignant melanoma measuring 1.2 mm was Clark's level III and no ulceration. She was given as pathological stage T2a. She subsequently underwent a wide local excision done on 06/03/2015 by Dr. Rolanda Jay. She also underwent a sentinel lymph node sampling. Her final pathology revealed no residual malignant melanoma noted with negative margins. The final Clark's level was 4 without lymphovascular invasion and maximal thickness of 1.2 mm. She did have a positive sentinel lymph node biopsy with supper capsular a total deposit was 1.1 x 0.3 mm. No extracapsular extension noted. The final pathological staging was T2aN1a. She subsequently underwent axillary lymph node dissection on the right and she had 0 out of 20 lymph nodes involved. She did have a PET scan on 06/17/2015 which showed no metastatic disease. She recovered well from that operation and does report slight tenderness in her arm and shoulder. She does have some swelling associated with it at nighttime. She has resumed most activities of daily living although have not resumed work-related duties. She has no other complaints at this time although she is under a lot of stress as she is caring for her husband who was diagnosed with stage IV pancreatic cancer. Performance status is excellent and has no other complaints. She denied any family history of melanoma although her daughter has diagnosed with what appeared to be melanoma in situ. She does report chronic sun exposure and recurrent sunburns melena.  She does not report any headaches, blurry vision, syncope or seizures. She does not report any fevers, chills, sweats or weight loss. Does not report any  chest pain, palpitation orthopnea. Does not report any cough, wheezing or hemoptysis. Does not report any nausea, vomiting or abdominal pain. Does not report any frequency urgency or hesitancy. Does not report any skeletal complaints of arthralgias or myalgias. Remaining review of systems unremarkable.   Past Medical History  Diagnosis Date  . Hypothyroidism   . Arthritis   . Anxiety   . Headache   . PONV (postoperative nausea and vomiting)   . Systemic lupus ([place])   . 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 Sjogren's syndrome ([place])   . Wears glasses   . Wears partial dentures     lower  :  Past Surgical History  Procedure Laterality Date  . Excision melanoma with sentinel lymph node biopsy Right 06/03/2015    Procedure: WIDE LOCAL RIGHT FOREARM EXCISION MELANOMA WITH SENTINEL LYMPH NODE MAPPING BIOPSY;  Surgeon: Hall Busing, MD;  Location: [place];  Service: General;  Laterality: Right;  . Excisional  benign breast bx's Bilateral 1990's  . Tonsillectomy  age 50's  . Tubal ligation  1984  . Dilation and curettage of uterus    . Cystoscopy w/ retrogrades  1990's  . Axillary lymph node biopsy Right 07/21/2015    Procedure: RIGHT AXILLARY LYMPHADENECTOMY;  Surgeon: Hall Busing, MD;  Location: [facility];  Service: General;  Laterality: Right;  :   Current outpatient prescriptions:  .  ALPRAZolam (XANAX) 0.5 MG tablet, TAKE 1/2  TO 1 TABLET 3 TIMES A DAY AS NEEDED FOR ANXIETY, Disp: , Rfl: 0 .  aspirin-acetaminophen-caffeine (EXCEDRIN MIGRAINE) 250-250-65 MG tablet, Take 2 tablets by mouth daily as needed for headache or migraine., Disp: , Rfl:  .  Cholecalciferol (VITAMIN D) 2000 units CAPS, Take 2,000 Units by mouth daily., Disp: , Rfl:  .  docusate sodium (COLACE) 100 MG capsule, Take 100 mg by mouth daily., Disp:  , Rfl:  .  folic acid (FOLVITE) 1 MG tablet, Take 2 mg by mouth daily. , Disp: , Rfl:  .  hydroxychloroquine (PLAQUENIL) 200 MG tablet, Take 200 mg by mouth 2 (two) times daily., Disp: , Rfl:  .  levothyroxine (SYNTHROID, LEVOTHROID) 100 MCG tablet, Take 100 mcg by mouth daily before breakfast., Disp: , Rfl:  .  magic mouthwash SOLN, SWISH 1 TO 2 TEASPOONS BY MOUTH TWICE DAILY AS NEEDED FOR MOUTH SORES, Disp: , Rfl: 12 .  methotrexate (RHEUMATREX) 2.5 MG tablet, Take 22.5 mg by mouth once a week. Caution:Chemotherapy. Protect from light.--  Pt takes on friday's, Disp: , Rfl:  .  POTASSIUM PO, Take 595 mg by mouth daily., Disp: , Rfl:  .  predniSONE (DELTASONE) 5 MG tablet, Take 5 mg by mouth every morning. , Disp: , Rfl:  .  Probiotic Product (HEALTHY COLON PO), Take 1 tablet by mouth daily., Disp: , Rfl:  .  ranitidine (ZANTAC) 150 MG tablet, Take 150 mg by mouth 2 (two) times daily as needed for heartburn., Disp: , Rfl:  .  vitamin E 400 UNIT capsule, Take 400 Units by mouth daily., Disp: , Rfl:  .  zolpidem (AMBIEN) 5 MG tablet, Take 2.5-5 mg by mouth at bedtime as needed for sleep. , Disp: , Rfl:  .  oxyCODONE (OXY IR/ROXICODONE) 5 MG immediate release tablet, Take 1-2 tablets (5-10 mg total) by mouth every 4 (four) hours as needed for severe pain. (Patient not taking: Reported on 08/20/2015), Disp: 60 tablet, Rfl: 0 .  oxyCODONE-acetaminophen (ROXICET) 5-325 MG tablet, Take 1 tablet by mouth every 6 (six) hours as needed. (Patient not taking: Reported on 08/20/2015), Disp: 20 tablet, Rfl: 0:  No Known Allergies:  No family history on file.:  Social History   Social History  . Marital Status: Married    Spouse Name: N/A  . Number of Children: N/A  . Years of Education: N/A   Occupational History  . Not on file.   Social History Main Topics  . Smoking status: Former Smoker -- 5 years    Types: Cigarettes    Quit date: 05/26/2008  . Smokeless tobacco: Never Used  . Alcohol Use: No   . Drug Use: No  . Sexual Activity: Not on file   Other Topics Concern  . Not on file   Social History Narrative  :  Pertinent items are noted in HPI.  Exam: Blood pressure 136/55, pulse 89, temperature 98.3 F (36.8 C), temperature source Oral, resp. rate 18, height 5\' 7"  (1.702 m), weight 121 lb 1.6 oz (54.931 kg), SpO2 99 %. General appearance: alert and cooperative appeared without distress. Head: Normocephalic, without obvious abnormality Throat: lips, mucosa, and tongue normal; teeth and gums normal Neck: no adenopathy Back: negative Resp: clear to auscultation bilaterally Chest wall: no tenderness Cardio: regular rate and rhythm, S1, S2 normal, no murmur, click, rub or gallop GI: soft, non-tender; bowel sounds normal; no masses,  no organomegaly Pulses: 2+ and symmetric Skin: Skin color, texture, turgor normal. No rashes or lesions. Well-healed  scar noted on the right forearm. Lymph nodes: Cervical, supraclavicular, and axillary nodes normal.    EXAM: NUCLEAR MEDICINE PET WHOLE BODY  TECHNIQUE: 7 point to mCi F-18 FDG was injected intravenously. Full-ring PET imaging was performed from the vertex to the feet after the radiotracer. CT data was obtained and used for attenuation correction and anatomic localization.  FASTING BLOOD GLUCOSE: Value: 80 mg/dl  COMPARISON: CT chest 12/25/2009.  FINDINGS: Head/Neck: No hypermetabolic lymph nodes in the neck. CT images show no acute findings.  Chest: No hypermetabolic mediastinal, hilar or axillary lymph nodes. No hypermetabolic pulmonary nodules. Surgical clips in the right axilla from recent no or biopsy. No pericardial or pleural effusion. 4 mm smudgy nodule along the right major fissure is indicative of subpleural lymph node.  Abdomen/Pelvis: No abnormal hypermetabolism in the liver, adrenal glands, spleen or pancreas. Low-attenuation lesions in the liver measure up to 3.2 cm, similar to 12/25/2009.  There may be stones in the gallbladder. Adrenal glands, kidneys, spleen, pancreas, stomach and bowel are grossly unremarkable. Calcification in the small bowel mesentery is nonspecific. There is no architectural distortion or spiculation in association.  Skeleton: No abnormal osseous hypermetabolism.  Extremities: No abnormal hypermetabolism.  IMPRESSION: 1. No evidence of metastatic disease. 2. Question cholelithiasis.     Assessment and Plan:   60 year old woman with the following issues:  1. Malignant melanoma diagnosed in March 2017 on the right forearm. She is status post wide excision and sentinel lymph node sampling which showed T2aN1a disease after complete axillary lymph node dissection showed no other involvement in 20 lymph nodes sampled.  The natural course of this disease was discussed with the patient and her husband today. The role of adjuvant therapy and high risk melanoma was reviewed. I explained to them that there are 3 drugs approved for the adjuvant therapy of high risk melanoma. High-dose interferon, pegylated interferon and a Ipilimumab.   The logistics of administration of interferon was reviewed today and toxicity associated with this medication was reviewed including leukocytopenia, increased transaminases and depression. The benefit of interferon therapy have been documented on a number of ECOG trials including ECOG 1684 and ECOG 1694. However, the toxicities associated with interferon has been a prohibitive for the right use of this medication especially in her case were depression is a major issue as she was dealing with the aggressive diagnosis of pancreatic cancer with her husband.  Ipilimumab has also been approved for this indication compared to placebo with demonstration of improvement in progressive free survival as well as overall survival. However, the dose that is approved is 10 mg/kg every 3 weeks for a total of 4 treatments have been associated  with a lot of complications. These would include colitis, pneumonitis, hepatitis among others. Treating her with this medication would be complicated given her history of autoimmune diseases and the possibility of lupus flare and potential end organ complications is rather high.  Ongoing clinical trials comparing ipilimumab at 10 mg/kg versus 3 mg/kg his ongoing and the results are not available for clinical interpretation at this time.  Alternatively, observation and surveillance would be a reasonable option at this point given the fact that her melanoma involved one lymph node and that is slightly more than 1 mm in depth.  Overall, I feel the risk of adjuvant therapy might not be warranted given her overall cancer recurrence risk in the price associated with her therapy.  She will think about these options at this time and will let me know  in the future whether to proceed with observation and surveillance versus adjuvant therapy.  2. Follow-up: Will be as needed depending on her decision of pursuing adjuvant therapy. He will follow-up with Dr. Rolanda Jay for active surveillance if she chooses to do so.

## 2015-08-22 NOTE — Progress Notes (Signed)
Please see consult note.  

## 2015-08-25 ENCOUNTER — Telehealth: Payer: Self-pay

## 2015-08-25 NOTE — Telephone Encounter (Signed)
Incoming call from a patient of Dr Eddie Dibbles , patient called to update Dr Rolanda Jay that she completed her initial consultation with Dr Zola Button and decided to proceed with observation and surveillance only. Patient states she would prefer to continue with Dr Eddie Dibbles for observation and surveillance .Writer informed the patient that we would update Dr Rolanda Jay and contact her with his recommendations for observation and surveillance and schedule an appointment per recommendations. Patient states understanding , denies further questions at this time , will call with nay changes , questions or concerns.

## 2015-08-26 ENCOUNTER — Telehealth: Payer: Self-pay | Admitting: *Deleted

## 2015-08-26 NOTE — Telephone Encounter (Signed)
Pt notified of future appointment. Pt has a scheduled appointment with Dr. Rolanda Jay on 12/02/15. Pt agreed with time and date of appointment

## 2015-10-13 NOTE — Progress Notes (Signed)
Late entry:  10/13/2015 Drain removed without difficulty.

## 2015-12-01 ENCOUNTER — Telehealth: Payer: Self-pay | Admitting: General Surgery

## 2015-12-01 NOTE — Telephone Encounter (Signed)
12/02/2015 Appointment rescheduled per patient request to 12/16/2015.

## 2015-12-02 ENCOUNTER — Ambulatory Visit: Payer: 59 | Admitting: General Surgery

## 2015-12-16 ENCOUNTER — Encounter: Payer: Self-pay | Admitting: General Surgery

## 2015-12-16 ENCOUNTER — Ambulatory Visit (HOSPITAL_BASED_OUTPATIENT_CLINIC_OR_DEPARTMENT_OTHER): Payer: 59 | Admitting: General Surgery

## 2015-12-16 VITALS — BP 127/61 | HR 86 | Temp 98.5°F | Resp 18 | Wt 117.1 lb

## 2015-12-16 DIAGNOSIS — C4361 Malignant melanoma of right upper limb, including shoulder: Secondary | ICD-10-CM | POA: Diagnosis not present

## 2015-12-16 DIAGNOSIS — C439 Malignant melanoma of skin, unspecified: Secondary | ICD-10-CM

## 2015-12-16 NOTE — Progress Notes (Signed)
I had the pleasure of meeting with Lauren Mccoy today with her daughter in clinic. She is here in routine follow-up status post wide local excision with sentinel lymph node biopsy and subsequent completion right axillary node dissection for a T2a, N1a, M0 malignant melanoma of the right dorsal lateral forearm. She met with Dr. Alen Blew to discuss adjuvant therapy and after discussing the pros and cons has elected surveillance. As such, she is now establishing herself with me for long-term surveillance. She reports an area proximal of her primary incision has been raised, reddish, and occasionally scales and then peals off. She has no associated pain with this area in question. But does note that this is a change. She also has a small reddish area on her distal anterior medial right lower leg and her anterior lower left leg. These are very small and have not changed significantly but she does report she believes these are new. She denies any adenopathy. She denies any arm swelling. She still has the persistent numbness of the right posterior medial arm consistent with her lymphadenectomy but no pain associated with this.  Unfortunately, since her last visit her husband passed away from pancreatic cancer and she is been dealing with the emotional stress is associated with this. She has noted a decrease in appetite related to this but is working actively to maintain her intake. She is also under additional stress in that she is helping care for her mother who has Alzheimer's disease.  Physical examination: Physical examination performed with a female chaperone in the room. Full skin survey was performed from scalp to soles feet. She has multiple small seborrheic keratoses and dermatofibrosis. There is a 4 mm x 3 mm minimally elevated reddish area with irregular margins proximal of her primary incision on her dorsal lateral right forearm. There are no evidence of lesions along her primary resection. On her right  lower leg there is a 1 mm red area that is minimally elevated with no surrounding pigmentation. This is on the distal anterior medial right lower leg. There is a similar appearing 1 mm red area that is minimally elevated with no stranding pigmentation on the anterior distal left lower leg. No other dominant or suspicious skin lesions were identified. All lymphatic basins were examined and there was no evidence of adenopathy. This included cervical supraclavicular axillary and inguinal basins.  Impression: Patient with a T2a, N1 A, M0 (stage grouping III) malignant melanoma the right dorsal lateral forearm. Currently has elected surveillance strategy.  Plan: We reviewed in CCN guidelines and I have recommended that she undergo a PET/CT scan and we will see her back to review those results. We will also refer her to dermatology and they have requested to see Dr. Camillo Flaming and we will be making the referral. She will asked that they do biopsies of the areas in question. All questions were answered and we will see her back after she is completed her PET CT scan.

## 2015-12-16 NOTE — Patient Instructions (Signed)
Plan to have your PET scan on December 24, 2015 at 8 am.  Nothing to eat or drink 6 hours before.  Plan to follow up with Dr. Rolanda Jay in the office on October 17.  We will make a referral for you to meet with a dermatologist with the plan to be for you to have someone (Dr. Rolanda Jay and the dermatologist) assessing your skin every three months.

## 2015-12-24 ENCOUNTER — Encounter (HOSPITAL_COMMUNITY)
Admission: RE | Admit: 2015-12-24 | Discharge: 2015-12-24 | Disposition: A | Discharge status: Home / Self Care | Payer: 59 | Source: Ambulatory Visit | Attending: Gynecologic Oncology | Admitting: Gynecologic Oncology

## 2015-12-24 DIAGNOSIS — C439 Malignant melanoma of skin, unspecified: Secondary | ICD-10-CM | POA: Insufficient documentation

## 2015-12-24 LAB — GLUCOSE, CAPILLARY: Glucose-Capillary: 85 mg/dL (ref 65–99)

## 2015-12-24 MED ORDER — FLUDEOXYGLUCOSE F - 18 (FDG) INJECTION
5.7100 | Freq: Once | INTRAVENOUS | Status: AC | PRN
  Administered 2015-12-24: 5.71 via INTRAVENOUS

## 2015-12-29 ENCOUNTER — Telehealth: Payer: Self-pay | Admitting: Gynecologic Oncology

## 2015-12-29 ENCOUNTER — Other Ambulatory Visit: Payer: Self-pay | Admitting: Gynecologic Oncology

## 2015-12-29 ENCOUNTER — Telehealth: Payer: Self-pay | Admitting: *Deleted

## 2015-12-29 DIAGNOSIS — C4361 Malignant melanoma of right upper limb, including shoulder: Secondary | ICD-10-CM

## 2015-12-29 NOTE — Telephone Encounter (Signed)
Per Dr. Rolanda Jay patient needs a referral to Dermatology specialist to be seen in 3 months  , referral was placed and appointment was obtained. Called patient to notify of appointment. Pt is scheduled with Dr. Renda Rolls on 04/12/16 at 2:00pm. Pt requested that she have a sooner appointment. Pt also states " I will call and get a sooner appointment

## 2015-12-29 NOTE — Telephone Encounter (Signed)
Patient had called wanting to see if she still needed to keep her appt with Dr. Rolanda Jay since she had seen her PET results on Mychart and they were negative.  Touched base with Dr. Rolanda Jay and ok for the patient to cancel her appt.  Advised to follow up with dermatology in three months and Dr. Rolanda Jay in six months with a PET scan before.  Advised to call for any needs.  Advised our office would follow up with the dermatology office since she had not heard anything about her appt.

## 2015-12-30 ENCOUNTER — Telehealth: Payer: Self-pay | Admitting: Gynecologic Oncology

## 2015-12-30 ENCOUNTER — Ambulatory Visit: Payer: 59 | Admitting: General Surgery

## 2015-12-30 NOTE — Telephone Encounter (Signed)
Patient informed of opening with Dr. Renda Rolls tomorrow at 8:20am to have the two lesions of concern looked at and to establish care.

## 2016-01-09 ENCOUNTER — Encounter (HOSPITAL_COMMUNITY): Payer: 59

## 2016-05-25 ENCOUNTER — Encounter (HOSPITAL_COMMUNITY)
Admission: RE | Admit: 2016-05-25 | Discharge: 2016-05-25 | Disposition: A | Discharge status: Home / Self Care | Payer: 59 | Source: Ambulatory Visit | Attending: Gynecologic Oncology | Admitting: Gynecologic Oncology

## 2016-05-25 ENCOUNTER — Telehealth: Payer: Self-pay | Admitting: General Surgery

## 2016-05-25 DIAGNOSIS — C4361 Malignant melanoma of right upper limb, including shoulder: Secondary | ICD-10-CM

## 2016-05-25 LAB — GLUCOSE, CAPILLARY: GLUCOSE-CAPILLARY: 84 mg/dL (ref 65–99)

## 2016-05-25 MED ORDER — FLUDEOXYGLUCOSE F - 18 (FDG) INJECTION: 7.5000 | Freq: Once | INTRAVENOUS | Status: DC | PRN

## 2016-05-26 ENCOUNTER — Telehealth: Payer: Self-pay | Admitting: General Surgery

## 2016-05-26 NOTE — Telephone Encounter (Signed)
I spoke with the patient and reviewed her PET CT scan findings from 05/25/2016. We discussed that there were no findings of recurrent or metastatic disease, stable hepatic cyst and speckled calcifications along the mesentery. She understands and will be scheduled for follow-up appointment with me in the near future.

## 2016-05-27 ENCOUNTER — Ambulatory Visit (HOSPITAL_COMMUNITY): Payer: 59

## 2016-06-08 ENCOUNTER — Telehealth: Payer: Self-pay | Admitting: General Surgery

## 2016-06-08 NOTE — Telephone Encounter (Signed)
Left message for patient re appointment with Dr. Rolanda Jay 5/3. Patient asked to call me directly if she cannot keep appointment. Schedule mailed.

## 2016-06-09 NOTE — Telephone Encounter (Signed)
See comments  

## 2016-06-14 ENCOUNTER — Ambulatory Visit (HOSPITAL_COMMUNITY): Payer: 59

## 2016-06-15 ENCOUNTER — Ambulatory Visit: Payer: 59 | Admitting: General Surgery

## 2016-06-22 ENCOUNTER — Encounter: Payer: Self-pay | Admitting: General Surgery

## 2016-06-22 ENCOUNTER — Ambulatory Visit: Payer: 59 | Admitting: General Surgery

## 2016-06-22 ENCOUNTER — Ambulatory Visit (HOSPITAL_BASED_OUTPATIENT_CLINIC_OR_DEPARTMENT_OTHER): Payer: 59 | Admitting: General Surgery

## 2016-06-22 VITALS — BP 125/75 | HR 88 | Temp 98.8°F | Resp 20 | Wt 107.6 lb

## 2016-06-22 DIAGNOSIS — C4361 Malignant melanoma of right upper limb, including shoulder: Secondary | ICD-10-CM | POA: Diagnosis not present

## 2016-06-22 DIAGNOSIS — F4323 Adjustment disorder with mixed anxiety and depressed mood: Secondary | ICD-10-CM | POA: Diagnosis not present

## 2016-06-22 DIAGNOSIS — C439 Malignant melanoma of skin, unspecified: Secondary | ICD-10-CM

## 2016-06-22 NOTE — Progress Notes (Signed)
Clinic note: I had the pleasure of seeing [name] in routine follow-up status post resection of right forearm malignant melanoma and sentinel lymph node biopsy with subsequent positive sentinel lymph node. She underwent right axillary completion node dissection and is now under routine follow-up. She declined adjuvant immunotherapy and is now under surveillance. Of note she is experienced several life stresses including the loss of her husband pancreatic cancer, her own diagnosis with melanoma and most recently the loss of her mother for whom she was the primary caregiver. She now edges she's had over 20 pounds of weight loss and into new date anxiety and grief primarily surrounding the loss of her husband. She acknowledges that she does have support from her children who live nearby and sees them on the weekends. She recently went to the beach was able to spend time with her family there. She spoke with her primary care physician regarding her anxiety and weight loss and was offered antidepressant medication and other supportive therapies which she has declined. Her feeling is she just needs to work through this. She is not taking anything to try to improve her appetite and is not interested in anything. She reports she became aware of palpable lymph nodes in her groin bilaterally but these are not significant change in size and only became apparent when she lost a significant amount of weight. Physical examination: Performed with a female chaperone in the room. Full skin survey was performed from scalp to soles of feet. She does have evidence of erythema on her bilateral upper chest as well as her upper back and scattered areas on her extremities related to her diagnosis of lupus which was exacerbated by recent sun exposure. Her primary incision site is clean, dry without erythema warmth or discharge and no evidence of local recurrence. She has no dominant or suspicious masses on skin  examination. Lymphatics: No palpable lymphadenopathy in the neck supraclavicular or axillary basins. She does have palpable shotty lymph nodes in bilateral groins that are not suspicious. Radiologic studies: CLINICAL DATA:  Subsequent treatment strategy for malignant melanoma of the right arm. EXAM: NUCLEAR MEDICINE PET WHOLE BODY TECHNIQUE: 7.5 mCi F-18 FDG was injected intravenously. Full-ring PET imaging was performed from the vertex to the feet after the radiotracer. CT data was obtained and used for attenuation correction and anatomic localization. FASTING BLOOD GLUCOSE:  Value: 85 mg/dl COMPARISON:  Multiple exams, including 12/24/2015 FINDINGS: HEAD/NECK No hypermetabolic activity in the scalp. No hypermetabolic cervical lymph nodes. CHEST No hypermetabolic mediastinal or hilar nodes. No suspicious pulmonary nodules on the CT data. Mild scarring at the lung apices. 2 mm subpleural lymph node along the lower part of the right major fissure. ABDOMEN/PELVIS No abnormal hypermetabolic activity within the liver, pancreas, adrenal glands, or spleen. No hypermetabolic lymph nodes in the abdomen or pelvis. Several hepatic cysts are observed. Dense speckled calcification in a mesenteric structure slightly shifted in position but otherwise unchanged from prior. SKELETON No focal hypermetabolic activity to suggest skeletal metastasis. EXTREMITIES No abnormal hypermetabolic activity in the lower extremities. IMPRESSION: 1. No findings of recurrent or metastatic disease. 2. Stable hepatic cysts. 3. Stable speckled calcifications along the mesentery. Electronically Signed   By: Van Clines M.D.   On: 05/25/2016 09:21 Impression: Patient without evidence of metastatic disease or recurrence on physical examination. She is suffering from anxiety and grief response secondary to the loss of her husband and mother with associated weight loss. Plan: We will see the patient back  in 6  months time for her full skin survey and PET CT scan. She will see her dermatologist in 3 months for full skin examination. We had an extended discussion regarding her anxiety and grief related to her life stresses and recent losses of left once. She is unwilling to accept referral for psychiatric support, counseling or except any form of medication for depression or appetite stimulation. Her preference at the present time is to work through this and she is comfortable with her support system of her children. I once again emphasized that we would be more than happy to make those referrals if she were to change her mind.

## 2016-07-21 ENCOUNTER — Other Ambulatory Visit: Payer: Self-pay | Admitting: Gynecologic Oncology

## 2016-07-21 DIAGNOSIS — C4361 Malignant melanoma of right upper limb, including shoulder: Secondary | ICD-10-CM

## 2016-07-22 ENCOUNTER — Telehealth: Payer: Self-pay | Admitting: *Deleted

## 2016-07-22 ENCOUNTER — Other Ambulatory Visit: Payer: Self-pay | Admitting: Gynecologic Oncology

## 2016-07-22 DIAGNOSIS — C4361 Malignant melanoma of right upper limb, including shoulder: Secondary | ICD-10-CM

## 2016-07-22 NOTE — Telephone Encounter (Signed)
I have called and spoke to the patient regarding her appts for October. Patient request the that we schedule the scan for September and the MD appt for October. Patient request that I call her on Monday to schedule her appts.

## 2016-10-05 ENCOUNTER — Telehealth: Payer: Self-pay

## 2016-10-05 NOTE — Telephone Encounter (Signed)
Told Lauren Mccoy that with Dr. Rolanda Jay no longer with this practice, Dr. Alen Blew is glad to see her on 12-02-16 at 145 to review the Pet scan results from 11-30-16. Patient appreciative.

## 2016-11-30 ENCOUNTER — Encounter (HOSPITAL_COMMUNITY): Payer: 59

## 2016-11-30 ENCOUNTER — Ambulatory Visit (HOSPITAL_COMMUNITY)
Admission: RE | Admit: 2016-11-30 | Discharge: 2016-11-30 | Disposition: A | Discharge status: Home / Self Care | Payer: 59 | Source: Ambulatory Visit | Attending: Gynecologic Oncology | Admitting: Gynecologic Oncology

## 2016-11-30 DIAGNOSIS — I700 Atherosclerosis of aorta: Secondary | ICD-10-CM | POA: Insufficient documentation

## 2016-11-30 DIAGNOSIS — C4361 Malignant melanoma of right upper limb, including shoulder: Secondary | ICD-10-CM | POA: Diagnosis not present

## 2016-11-30 DIAGNOSIS — Z85820 Personal history of malignant melanoma of skin: Secondary | ICD-10-CM | POA: Diagnosis not present

## 2016-11-30 LAB — GLUCOSE, CAPILLARY: Glucose-Capillary: 83 mg/dL (ref 65–99)

## 2016-11-30 MED ORDER — FLUDEOXYGLUCOSE F - 18 (FDG) INJECTION
5.4000 | Freq: Once | INTRAVENOUS | Status: AC | PRN
  Administered 2016-11-30: 5.4 via INTRAVENOUS

## 2016-12-02 ENCOUNTER — Ambulatory Visit (HOSPITAL_BASED_OUTPATIENT_CLINIC_OR_DEPARTMENT_OTHER): Payer: 59 | Admitting: Oncology

## 2016-12-02 ENCOUNTER — Telehealth: Payer: Self-pay | Admitting: Oncology

## 2016-12-02 VITALS — BP 113/61 | HR 97 | Temp 98.6°F | Resp 17 | Ht 67.0 in | Wt 113.1 lb

## 2016-12-02 DIAGNOSIS — C4361 Malignant melanoma of right upper limb, including shoulder: Secondary | ICD-10-CM

## 2016-12-02 NOTE — Telephone Encounter (Signed)
Gave avs and calendar for march 2019 

## 2016-12-02 NOTE — Progress Notes (Signed)
Hematology and Oncology Follow Up Visit  Lauren Mccoy [id number] 09-30-1955 61 y.o. 12/02/2016 2:15 PM Lauren Mccoy, MDShaw, Lauren May, MD   Principle Diagnosis: 61 year old with a melanoma diagnosed in March 2017 on the right forearm.measuring 1.2 mm was Clark's level III and no ulceration. She had a T2aN1 disease   Prior Therapy: She is status post wide excision and sentinel lymph node sampling which showed T2aN1a disease after complete axillary lymph node dissection showed no other involvement in 20 lymph nodes sampled. She did have a positive sentinel lymph node biopsy with supper capsular a total deposit was 1.1 x 0.3 mm  Current therapy: Observation and surveillance.  Interim History: Lauren Mccoy presents today for a follow-up visit. She is a pleasant woman with history of melanoma and found to have early stage III disease. She declined adjuvant therapy at the time he continues to be on observation and surveillance. She lost her husband to pancreatic cancer and had a lot of emotional issues including physical complaints as well of weight loss. She is recovering and coping reasonably well. She denies any abdominal pain, weight loss or appetite changes. Her performance status and activity level remains excellent. She continues to have dermatology surveillance without any recent diagnosis of melanoma.  She does not report any headaches, blurry vision, syncope or seizures. She does not report any fevers, chills, sweats or weight loss. Does not report any chest pain, palpitation orthopnea. Does not report any cough, wheezing or hemoptysis. Does not report any nausea, vomiting or abdominal pain. Does not report any frequency urgency or hesitancy. Does not report any skeletal complaints of arthralgias or myalgias. Remaining review of systems unremarkable.  Medications: I have reviewed the patient's current medications.  Current Outpatient Prescriptions  Medication Sig Dispense Refill  .  ALPRAZolam (XANAX) 0.5 MG tablet TAKE 1/2 TO 1 TABLET 3 TIMES A DAY AS NEEDED FOR ANXIETY  0  . aspirin-acetaminophen-caffeine (EXCEDRIN MIGRAINE) 250-250-65 MG tablet Take 2 tablets by mouth daily as needed for headache or migraine.    . Cholecalciferol (VITAMIN D) 2000 units CAPS Take 2,000 Units by mouth daily.    Marland Kitchen docusate sodium (COLACE) 100 MG capsule Take 100 mg by mouth daily.    . folic acid (FOLVITE) 1 MG tablet Take 2 mg by mouth daily.     . hydroxychloroquine (PLAQUENIL) 200 MG tablet Take 200 mg by mouth 2 (two) times daily.    Marland Kitchen levothyroxine (SYNTHROID, LEVOTHROID) 100 MCG tablet Take 100 mcg by mouth daily before breakfast.    . magic mouthwash SOLN SWISH 1 TO 2 TEASPOONS BY MOUTH TWICE DAILY AS NEEDED FOR MOUTH SORES  12  . methotrexate (RHEUMATREX) 2.5 MG tablet Take 22.5 mg by mouth once a week. Caution:Chemotherapy. Protect from light.--  Pt takes on friday's    . POTASSIUM PO Take 595 mg by mouth daily.    . predniSONE (DELTASONE) 5 MG tablet Take 5 mg by mouth every morning.     . Probiotic Product (HEALTHY COLON PO) Take 1 tablet by mouth daily.    . ranitidine (ZANTAC) 150 MG tablet Take 150 mg by mouth 2 (two) times daily as needed for heartburn.    . vitamin E 400 UNIT capsule Take 400 Units by mouth daily.    Marland Kitchen zolpidem (AMBIEN) 5 MG tablet Take 2.5-5 mg by mouth at bedtime as needed for sleep.      No current facility-administered medications for this visit.      Allergies: No Known Allergies  Past Medical History, Surgical history, Social history, and Family History were reviewed and updated.   Remaining ROS negative. Physical Exam: Blood pressure 113/61, pulse 97, temperature 98.6 F (37 C), temperature source Oral, resp. rate 17, height 5\' 7"  (1.702 m), weight 113 lb 1.6 oz (51.3 kg), SpO2 100 %. ECOG:  0 General appearance: alert and cooperative Head: Normocephalic, without obvious abnormality Neck: no adenopathy Lymph nodes: Cervical,  supraclavicular, and axillary nodes normal. Heart:regular rate and rhythm, S1, S2 normal, no murmur, click, rub or gallop Lung:chest clear, no wheezing, rales, normal symmetric air entry Abdomin: soft, non-tender, without masses or organomegaly EXT:no erythema, induration, or nodules   EXAM: NUCLEAR MEDICINE PET WHOLE BODY  TECHNIQUE: 5 point for mCi F-18 FDG was injected intravenously. Full-ring PET imaging was performed from the vertex to the feet after the radiotracer. CT data was obtained and used for attenuation correction and anatomic localization.  FASTING BLOOD GLUCOSE:  Value: 83 mg/dl  COMPARISON:  05/25/2016  FINDINGS: HEAD/NECK  No hypermetabolic activity in the scalp. No hypermetabolic cervical lymph nodes.  CHEST  No hypermetabolic mediastinal or hilar nodes. No suspicious pulmonary nodules on the CT scan.  Biapical pleural-parenchymal scarring evident. 3 mm right perifissural nodule (image 51 series 8) is stable in the interval.  ABDOMEN/PELVIS  New hypermetabolic focus is identified in the liver, just anterior to the IVC with SUV max = 3.5. No definite CT correlate is identified on the noncontrast exam obtained for attenuation correction. Multiple hepatic cysts are again noted.  No abnormal hypermetabolic activity within the pancreas, adrenal glands, or spleen. No hypermetabolic lymph nodes in the abdomen or pelvis. Abdominal aortic atherosclerosis is evident. The dystrophic calcification in the central mesentery of the pelvis is stable.  SKELETON  New hypermetabolic activity is identified in the medial aspect of the left clavicle with SUV max = 2.5. CT imaging shows questionable subtle sclerosis at this position (image 79 of series 4), but this was present on the prior study.  EXTREMITIES  No abnormal hypermetabolic activity in the lower extremities.  IMPRESSION: 1. Subtle focal area of FDG accumulation in the central  liver without underlying CT abnormality. Given the history of melanoma, MRI of the abdomen without with contrast Mccoy be warranted to further evaluate. 2. New area of focal hypermetabolism identified in the medial aspect of the left clavicle. As marrow involvement can occur, but is not typical for metastatic melanoma, close attention on follow-up recommended. 3.  Aortic Atherosclerois (ICD10-170.0)     Impression and Plan:  61 year old woman with the following issues:  1. Malignant melanoma diagnosed in March 2017 on the right forearm. She is status post wide excision and sentinel lymph node sampling which showed T2aN1a disease after complete axillary lymph node dissection showed no other involvement in 20 lymph nodes sampled.  She declined adjuvant therapy at the time of her diagnosis. She is currently on active surveillance.  Her PET scan obtained on 11/30/2016 was personally reviewed and discussed with the patient. There is no clear-cut evidence of metastatic disease although they are questionable finding in her liver. MRI of the abdomen was recommended and will be completed in the near future.  If no evidence of disease is detected, we'll continue with active surveillance with physical examination in 6 months and repeat PET scan in one year. I'll repeat her laboratory data today including an LDH.  2. Follow-up: Will be in 6 months sooner if needed to.   Zola Button, MD 9/20/20182:15 PM

## 2016-12-07 ENCOUNTER — Ambulatory Visit: Payer: 59 | Admitting: General Surgery

## 2016-12-09 ENCOUNTER — Ambulatory Visit (HOSPITAL_COMMUNITY)
Admission: RE | Admit: 2016-12-09 | Discharge: 2016-12-09 | Disposition: A | Discharge status: Home / Self Care | Payer: 59 | Source: Ambulatory Visit | Attending: Oncology | Admitting: Oncology

## 2016-12-09 DIAGNOSIS — C4361 Malignant melanoma of right upper limb, including shoulder: Secondary | ICD-10-CM | POA: Diagnosis present

## 2016-12-09 DIAGNOSIS — K7689 Other specified diseases of liver: Secondary | ICD-10-CM | POA: Insufficient documentation

## 2016-12-09 LAB — POCT I-STAT CREATININE: Creatinine, Ser: 0.6 mg/dL (ref 0.44–1.00)

## 2016-12-09 MED ORDER — GADOBENATE DIMEGLUMINE 529 MG/ML IV SOLN
10.0000 mL | Freq: Once | INTRAVENOUS | Status: AC | PRN
  Administered 2016-12-09: 9 mL via INTRAVENOUS

## 2016-12-10 ENCOUNTER — Encounter: Payer: Self-pay | Admitting: *Deleted

## 2016-12-13 ENCOUNTER — Telehealth: Payer: Self-pay | Admitting: *Deleted

## 2016-12-13 NOTE — Telephone Encounter (Signed)
Per Dr. Alen Blew, I informed patient that her MRI was normal. Patient verbalized understanding.

## 2017-06-01 ENCOUNTER — Inpatient Hospital Stay: Attending: Oncology | Admitting: Oncology

## 2017-06-01 ENCOUNTER — Inpatient Hospital Stay

## 2017-06-01 ENCOUNTER — Telehealth: Payer: Self-pay

## 2017-06-01 VITALS — BP 117/73 | HR 94 | Temp 98.6°F | Resp 18 | Ht 67.0 in | Wt 114.1 lb

## 2017-06-01 DIAGNOSIS — R1013 Epigastric pain: Secondary | ICD-10-CM | POA: Diagnosis not present

## 2017-06-01 DIAGNOSIS — C4361 Malignant melanoma of right upper limb, including shoulder: Secondary | ICD-10-CM | POA: Diagnosis not present

## 2017-06-01 DIAGNOSIS — M329 Systemic lupus erythematosus, unspecified: Secondary | ICD-10-CM | POA: Insufficient documentation

## 2017-06-01 DIAGNOSIS — Z79899 Other long term (current) drug therapy: Secondary | ICD-10-CM | POA: Insufficient documentation

## 2017-06-01 LAB — CBC WITH DIFFERENTIAL/PLATELET
BASOS ABS: 0 10*3/uL (ref 0.0–0.1)
BASOS PCT: 1 %
EOS ABS: 0.1 10*3/uL (ref 0.0–0.5)
Eosinophils Relative: 3 %
HCT: 41.4 % (ref 34.8–46.6)
HEMOGLOBIN: 14.1 g/dL (ref 11.6–15.9)
Lymphocytes Relative: 25 %
Lymphs Abs: 1 10*3/uL (ref 0.9–3.3)
MCH: 33 pg (ref 25.1–34.0)
MCHC: 34.1 g/dL (ref 31.5–36.0)
MCV: 97 fL (ref 79.5–101.0)
Monocytes Absolute: 0.5 10*3/uL (ref 0.1–0.9)
Monocytes Relative: 13 %
NEUTROS PCT: 58 %
Neutro Abs: 2.4 10*3/uL (ref 1.5–6.5)
Platelets: 231 10*3/uL (ref 145–400)
RBC: 4.27 MIL/uL (ref 3.70–5.45)
RDW: 14 % (ref 11.2–14.5)
WBC: 4 10*3/uL (ref 3.9–10.3)

## 2017-06-01 LAB — COMPREHENSIVE METABOLIC PANEL
ALBUMIN: 4.2 g/dL (ref 3.5–5.0)
ALT: 17 U/L (ref 0–55)
ANION GAP: 7 (ref 3–11)
AST: 19 U/L (ref 5–34)
Alkaline Phosphatase: 61 U/L (ref 40–150)
BILIRUBIN TOTAL: 0.3 mg/dL (ref 0.2–1.2)
BUN: 18 mg/dL (ref 7–26)
CALCIUM: 10 mg/dL (ref 8.4–10.4)
CO2: 30 mmol/L — AB (ref 22–29)
Chloride: 103 mmol/L (ref 98–109)
Creatinine, Ser: 0.73 mg/dL (ref 0.60–1.10)
GFR calc Af Amer: >60 mL/min (ref >60)
GFR calc non Af Amer: >60 mL/min (ref >60)
GLUCOSE: 85 mg/dL (ref 70–140)
POTASSIUM: 4.3 mmol/L (ref 3.5–5.1)
SODIUM: 140 mmol/L (ref 136–145)
TOTAL PROTEIN: 7.2 g/dL (ref 6.4–8.3)

## 2017-06-01 LAB — LACTATE DEHYDROGENASE: LDH: 214 U/L (ref 125–245)

## 2017-06-01 NOTE — Progress Notes (Signed)
Hematology and Oncology Follow Up Visit  Lauren Mccoy [id number] 1955-10-21 62 y.o. 06/01/2017 10:37 AM [name], MDShaw, [name], MD   Principle Diagnosis: 62 year old woman with stage T2aN1 melanoma of the right forearm diagnosed in March 2017 her tumor was . measuring 1.2 mm was Clark's level III and no ulceration.    Prior Therapy: She is status post wide excision and sentinel lymph node sampling which showed T2aN1a disease after complete axillary lymph node dissection showed no other involvement in 20 lymph nodes sampled. She did have a positive sentinel lymph node biopsy with supper capsular a total deposit was 1.1 x 0.3 mm  Current therapy: Active surveillance.  Interim History: Lauren Mccoy is here for a follow-up.  Since the last visit, she reports no major changes in her health.  She continues to be on Plaquenil and methotrexate for her lupus without any major exacerbation or flares.  She does report some fatigue and tiredness associated with this therapy.  She denied any recent skin rashes or lesions.  She denies any constitutional symptoms or weight loss.  Appetite is reasonable.  She does report some occasional dyspepsia after eating.  She does not report any headaches, blurry vision, syncope or seizures. She does not report any fevers, chills, sweats or weight loss. Does not report any chest pain, palpitation orthopnea. Does not report any cough, wheezing or hemoptysis. Does not report any nausea, vomiting or abdominal pain. Does not report any frequency urgency or hesitancy. Does not report any skeletal complaints of arthralgias or myalgias.  She does not report any skin rashes or lesions.  She does not report lymphadenopathy or petechiae.  Remaining review of systems is negative.  Medications: I have reviewed the patient's current medications.  Current Outpatient Medications  Medication Sig Dispense Refill  . ALPRAZolam (XANAX) 0.5 MG tablet TAKE 1/2 TO 1 TABLET 3 TIMES A  DAY AS NEEDED FOR ANXIETY  0  . aspirin-acetaminophen-caffeine (EXCEDRIN MIGRAINE) 250-250-65 MG tablet Take 2 tablets by mouth daily as needed for headache or migraine.    . Cholecalciferol (VITAMIN D) 2000 units CAPS Take 2,000 Units by mouth daily.    Marland Kitchen docusate sodium (COLACE) 100 MG capsule Take 100 mg by mouth daily.    . folic acid (FOLVITE) 1 MG tablet Take 2 mg by mouth daily.     . hydroxychloroquine (PLAQUENIL) 200 MG tablet Take 200 mg by mouth 2 (two) times daily.    Marland Kitchen levothyroxine (SYNTHROID, LEVOTHROID) 100 MCG tablet Take 100 mcg by mouth daily before breakfast.    . magic mouthwash SOLN SWISH 1 TO 2 TEASPOONS BY MOUTH TWICE DAILY AS NEEDED FOR MOUTH SORES  12  . methotrexate (RHEUMATREX) 2.5 MG tablet Take 22.5 mg by mouth once a week. Caution:Chemotherapy. Protect from light.--  Pt takes on friday's    . POTASSIUM PO Take 595 mg by mouth daily.    . predniSONE (DELTASONE) 5 MG tablet Take 5 mg by mouth every morning.     . Probiotic Product (HEALTHY COLON PO) Take 1 tablet by mouth daily.    . ranitidine (ZANTAC) 150 MG tablet Take 150 mg by mouth 2 (two) times daily as needed for heartburn.    . vitamin E 400 UNIT capsule Take 400 Units by mouth daily.    Marland Kitchen zolpidem (AMBIEN) 5 MG tablet Take 2.5-5 mg by mouth at bedtime as needed for sleep.      No current facility-administered medications for this visit.  Allergies: No Known Allergies  Past Medical History, Surgical history, Social history, and Family History were reviewed and updated.   Physical Exam: Blood pressure 117/73, pulse 94, temperature 98.6 F (37 C), temperature source Oral, resp. rate 18, height 5\' 7"  (1.702 m), weight 114 lb 1.6 oz (51.8 kg), SpO2 100 %. ECOG:  0 General appearance: Well-appearing gentleman without distress. Head: Atraumatic without abnormalities. Oropharynx: Without ulcers or thrush. Eyes: No scleral icterus. Lymph nodes: Cervical, supraclavicular, and axillary nodes  normal. Heart: Regular rate and rhythm without murmurs rubs or gallops.  Lung: Clear to auscultation without any rhonchi wheezes or dullness to percussion. Abdomin: Soft, nontender, nondistended without any rebound or guarding. Skin: No rashes or lesions.  No petechia or ecchymosis.  CBC    Component Value Date/Time   WBC 4.0 06/01/2017 0959   RBC 4.27 06/01/2017 0959   HGB 14.1 06/01/2017 0959   HCT 41.4 06/01/2017 0959   PLT 231 06/01/2017 0959   MCV 97.0 06/01/2017 0959   MCH 33.0 06/01/2017 0959   MCHC 34.1 06/01/2017 0959   RDW 14.0 06/01/2017 0959   LYMPHSABS 1.0 06/01/2017 0959   MONOABS 0.5 06/01/2017 0959   EOSABS 0.1 06/01/2017 0959   BASOSABS 0.0 06/01/2017 0959   '  EXTREMITIES  No abnormal hypermetabolic activity in the lower extremities.  IMPRESSION: 1. Subtle focal area of FDG accumulation in the central liver without underlying CT abnormality. Given the history of melanoma, MRI of the abdomen without with contrast may be warranted to further evaluate. 2. New area of focal hypermetabolism identified in the medial aspect of the left clavicle. As marrow involvement can occur, but is not typical for metastatic melanoma, close attention on follow-up recommended. 3.  Aortic Atherosclerois (ICD10-170.0)     Impression and Plan:  62 year old woman with the following issues:  1.  Stage T2aN1 malignant melanoma of the right forearm diagnosed in March 2017 on the right forearm.  He is status post a wide excision and sentinel lymph node sampling followed by axillary dissection.   Risks and benefits of adjuvant immunotherapy was reviewed at the time and she opted to continue with active surveillance.  She does not have any evidence of recurrent disease and last PET scan obtained 6 months ago was reviewed today without any evidence of recurrent disease.  She did have a liver abnormalities that was evaluated further with an MRI was unremarkable.  The plan is  to continue with active surveillance with physical examination and repeat imaging studies in 6 months.  At that time we can decrease the frequency of her scanning moving forward.  I continue to counsel her about skin protection and she continues to follow with dermatology for surveillance as well.  2.  Systemic lupus: She remains on methotrexate and Plaquenil.  No recent exacerbations.  Her CBC reviewed today and was within normal range.  3. Follow-up: Will be in 6 months.   15  minutes was spent with the patient face-to-face today.  More than 50% of time was dedicated to patient counseling, education and coordination of her care.    Zola Button, MD 3/20/201910:37 AM

## 2017-06-01 NOTE — Telephone Encounter (Signed)
Printed avs and calender of upcoming appointment. Per 3/20 los 

## 2017-09-27 ENCOUNTER — Telehealth: Payer: Self-pay | Admitting: Oncology

## 2017-09-27 NOTE — Telephone Encounter (Signed)
Pt wanted lab moved to 9/19

## 2017-11-24 ENCOUNTER — Ambulatory Visit (HOSPITAL_COMMUNITY)
Admission: RE | Admit: 2017-11-24 | Discharge: 2017-11-24 | Disposition: A | Discharge status: Home / Self Care | Source: Ambulatory Visit | Attending: Oncology | Admitting: Oncology

## 2017-11-24 ENCOUNTER — Other Ambulatory Visit

## 2017-11-24 ENCOUNTER — Telehealth: Payer: Self-pay | Admitting: *Deleted

## 2017-11-24 DIAGNOSIS — I700 Atherosclerosis of aorta: Secondary | ICD-10-CM | POA: Insufficient documentation

## 2017-11-24 DIAGNOSIS — C4361 Malignant melanoma of right upper limb, including shoulder: Secondary | ICD-10-CM | POA: Insufficient documentation

## 2017-11-24 LAB — GLUCOSE, CAPILLARY: Glucose-Capillary: 89 mg/dL (ref 70–99)

## 2017-11-24 MED ORDER — FLUDEOXYGLUCOSE F - 18 (FDG) INJECTION
5.6200 | Freq: Once | INTRAVENOUS | Status: AC | PRN
Start: 2017-11-24 — End: 2017-11-24
  Administered 2017-11-24: 5.62 via INTRAVENOUS

## 2017-11-24 NOTE — Telephone Encounter (Signed)
Spoke with patient. Per dr Alen Blew, scan results are normal.

## 2017-11-24 NOTE — Telephone Encounter (Signed)
-----   Message from Wyatt Portela, MD sent at 11/24/2017 11:38 AM EDT ----- Please let her know her scan is normal. She has appointment next week but I did not want her to waite till next week to know.

## 2017-12-01 ENCOUNTER — Telehealth: Payer: Self-pay

## 2017-12-01 ENCOUNTER — Inpatient Hospital Stay: Attending: Oncology | Admitting: Oncology

## 2017-12-01 ENCOUNTER — Inpatient Hospital Stay

## 2017-12-01 VITALS — BP 118/70 | HR 78 | Temp 98.2°F | Resp 18 | Ht 67.0 in | Wt 117.8 lb

## 2017-12-01 DIAGNOSIS — Z85820 Personal history of malignant melanoma of skin: Secondary | ICD-10-CM | POA: Diagnosis not present

## 2017-12-01 DIAGNOSIS — I700 Atherosclerosis of aorta: Secondary | ICD-10-CM | POA: Diagnosis not present

## 2017-12-01 DIAGNOSIS — C4361 Malignant melanoma of right upper limb, including shoulder: Secondary | ICD-10-CM

## 2017-12-01 DIAGNOSIS — M329 Systemic lupus erythematosus, unspecified: Secondary | ICD-10-CM | POA: Diagnosis not present

## 2017-12-01 DIAGNOSIS — Z79899 Other long term (current) drug therapy: Secondary | ICD-10-CM | POA: Insufficient documentation

## 2017-12-01 LAB — CBC WITH DIFFERENTIAL (CANCER CENTER ONLY)
BASOS ABS: 0 10*3/uL (ref 0.0–0.1)
Basophils Relative: 1 %
EOS ABS: 0 10*3/uL (ref 0.0–0.5)
Eosinophils Relative: 1 %
HEMATOCRIT: 38.6 % (ref 34.8–46.6)
HEMOGLOBIN: 13.1 g/dL (ref 11.6–15.9)
Lymphocytes Relative: 18 %
Lymphs Abs: 0.8 10*3/uL — ABNORMAL LOW (ref 0.9–3.3)
MCH: 32.8 pg (ref 25.1–34.0)
MCHC: 33.9 g/dL (ref 31.5–36.0)
MCV: 96.8 fL (ref 79.5–101.0)
Monocytes Absolute: 0.6 10*3/uL (ref 0.1–0.9)
Monocytes Relative: 13 %
NEUTROS ABS: 2.8 10*3/uL (ref 1.5–6.5)
NEUTROS PCT: 67 %
Platelet Count: 227 10*3/uL (ref 145–400)
RBC: 3.99 MIL/uL (ref 3.70–5.45)
RDW: 14.6 % — ABNORMAL HIGH (ref 11.2–14.5)
WBC: 4.3 10*3/uL (ref 3.9–10.3)

## 2017-12-01 LAB — CMP (CANCER CENTER ONLY)
ALK PHOS: 60 U/L (ref 38–126)
ALT: 12 U/L (ref 0–44)
AST: 18 U/L (ref 15–41)
Albumin: 4.3 g/dL (ref 3.5–5.0)
Anion gap: 10 (ref 5–15)
BUN: 11 mg/dL (ref 8–23)
CO2: 28 mmol/L (ref 22–32)
Calcium: 9.6 mg/dL (ref 8.9–10.3)
Chloride: 105 mmol/L (ref 98–111)
Creatinine: 0.71 mg/dL (ref 0.44–1.00)
GFR, Est AFR Am: >60 mL/min (ref >60)
GFR, Estimated: >60 mL/min (ref >60)
Glucose, Bld: 92 mg/dL (ref 70–99)
Potassium: 4 mmol/L (ref 3.5–5.1)
SODIUM: 143 mmol/L (ref 135–145)
TOTAL PROTEIN: 7.1 g/dL (ref 6.5–8.1)
Total Bilirubin: 0.4 mg/dL (ref 0.3–1.2)

## 2017-12-01 LAB — LACTATE DEHYDROGENASE: LDH: 189 U/L (ref 98–192)

## 2017-12-01 NOTE — Progress Notes (Signed)
Hematology and Oncology Follow Up Visit  Lauren Mccoy [id number] 1955/12/01 62 y.o. 12/01/2017 9:14 AM Lauren Mccoy, MDShaw, Lauren May, MD   Principle Diagnosis: 62 year old woman with melanoma of the right forearm diagnosed in March 2017.  She was found to have 1.2 mm stage T2aN1 melanoma with 1 out of 20 lymph node involved at the time of diagnosis.   Prior Therapy: She is status post wide excision and sentinel lymph node sampling which showed T2aN1a disease after complete axillary lymph node dissection showed no other involvement in 20 lymph nodes sampled. She did have a positive sentinel lymph node biopsy with supper capsular a total deposit was 1.1 x 0.3 mm  Current therapy: Active surveillance.  Interim History: Lauren Mccoy presents today for a follow-up.  Since her last visit, she reports no major changes in her health.  She continues to be active and attends to activities of daily living.  She is off prednisone at this time with improvement in her overall quality of life.  Her Synthroid was also increased and has decreased her her level of fatigue consequently.  She denies any skin rashes or lesions.  She denies any constitutional symptoms.  She does not report any headaches, blurry vision, syncope or seizures.  She denies any dizziness or confusion.  She does not report any fevers, chills, sweats or weight loss. Does not report any chest pain, palpitation orthopnea. Does not report any cough, wheezing or hemoptysis. Does not report any nausea, vomiting or abdominal pain.  He denies any change in her bowel habits.  Does not report any frequency urgency or hesitancy. Does not report any bone pain or pathological fractures.  She does not report any skin rashes or lesions.  She does not report lymphadenopathy or petechiae.  She denies any mood changes.  Remaining review of systems is negative.  Medications: I have reviewed the patient's current medications.  Current Outpatient Medications   Medication Sig Dispense Refill  . ALPRAZolam (XANAX) 0.5 MG tablet TAKE 1/2 TO 1 TABLET 3 TIMES A DAY AS NEEDED FOR ANXIETY  0  . aspirin-acetaminophen-caffeine (EXCEDRIN MIGRAINE) 250-250-65 MG tablet Take 2 tablets by mouth daily as needed for headache or migraine.    . Cholecalciferol (VITAMIN D) 2000 units CAPS Take 2,000 Units by mouth daily.    Marland Kitchen docusate sodium (COLACE) 100 MG capsule Take 100 mg by mouth daily.    . folic acid (FOLVITE) 1 MG tablet Take 2 mg by mouth daily.     . hydroxychloroquine (PLAQUENIL) 200 MG tablet Take 200 mg by mouth 2 (two) times daily.    Marland Kitchen levothyroxine (SYNTHROID, LEVOTHROID) 100 MCG tablet Take 100 mcg by mouth daily before breakfast.    . magic mouthwash SOLN SWISH 1 TO 2 TEASPOONS BY MOUTH TWICE DAILY AS NEEDED FOR MOUTH SORES  12  . methotrexate (RHEUMATREX) 2.5 MG tablet Take 22.5 mg by mouth once a week. Caution:Chemotherapy. Protect from light.--  Pt takes on friday's    . POTASSIUM PO Take 595 mg by mouth daily.    . predniSONE (DELTASONE) 5 MG tablet Take 5 mg by mouth every morning.     . Probiotic Product (HEALTHY COLON PO) Take 1 tablet by mouth daily.    . ranitidine (ZANTAC) 150 MG tablet Take 150 mg by mouth 2 (two) times daily as needed for heartburn.    . vitamin E 400 UNIT capsule Take 400 Units by mouth daily.    Marland Kitchen zolpidem (AMBIEN) 5 MG tablet Take  2.5-5 mg by mouth at bedtime as needed for sleep.      No current facility-administered medications for this visit.      Allergies: No Known Allergies  Past Medical History, Surgical history, Social history, and Family History were reviewed and updated.   Physical Exam: Blood pressure 118/70, pulse 78, temperature 98.2 F (36.8 C), temperature source Oral, resp. rate 18, height 5\' 7"  (1.702 m), weight 117 lb 12.8 oz (53.4 kg), SpO2 100 %.   ECOG:  0   General appearance: Alert, awake without any distress. Head:  Normocephalic atraumatic. Oropharynx: Without any thrush or  ulcers. Eyes: Pupils are equal and round reactive to light. Lymph nodes: No lymphadenopathy noted in the cervical, supraclavicular, or axillary nodes Heart:regular rate and rhythm, S1 and S2 without murmurs. Lung: Clear to auscultation.  No wheezes or rhonchi. Abdomin: Soft, nontender without any shifting dullness or ascites. Musculoskeletal: No clubbing or cyanosis. Neurological: No motor or sensory deficits. Skin: No rashes or lesions. Psychiatric: Mood and affect appeared normal.   CBC    Component Value Date/Time   WBC 4.3 12/01/2017 0852   WBC 4.0 06/01/2017 0959   RBC 3.99 12/01/2017 0852   HGB 13.1 12/01/2017 0852   HCT 38.6 12/01/2017 0852   PLT 227 12/01/2017 0852   MCV 96.8 12/01/2017 0852   MCH 32.8 12/01/2017 0852   MCHC 33.9 12/01/2017 0852   RDW 14.6 (H) 12/01/2017 0852   LYMPHSABS 0.8 (L) 12/01/2017 0852   MONOABS 0.6 12/01/2017 0852   EOSABS 0.0 12/01/2017 0852   BASOSABS 0.0 12/01/2017 0852   ' EXAM: NUCLEAR MEDICINE PET WHOLE BODY  TECHNIQUE: 5.6 mCi F-18 FDG was injected intravenously. Full-ring PET imaging was performed from the skull base to thigh after the radiotracer. CT data was obtained and used for attenuation correction and anatomic localization.  Fasting blood glucose: 89 mg/dl  COMPARISON:  MR abdomen 12/09/2016 and PET 11/30/2016.  FINDINGS: Mediastinal blood pool activity: SUV max 1.7  Note: There is a fair amount of muscular uptake.  HEAD/NECK: No abnormal hypermetabolism.  Incidental CT findings: None.  CHEST: No abnormal hypermetabolism.  Incidental CT findings: Atherosclerotic calcification of the arterial vasculature. Heart is at the upper limits of normal in size. No pericardial or pleural effusion. Biapical pleuroparenchymal scarring. No worrisome pulmonary nodules.  ABDOMEN/PELVIS: No abnormal hypermetabolism in the liver, adrenal glands, spleen or pancreas. No hypermetabolic lymph nodes.  Incidental CT  findings: Cysts in the liver, as on 12/09/2016. Gallbladder, adrenal glands, kidneys, spleen, pancreas, stomach and bowel are grossly unremarkable. Dystrophic calcification in the small bowel mesentery is unchanged.  SKELETON: No abnormal osseous hypermetabolism.  Incidental CT findings: None.  EXTREMITIES: No abnormal hypermetabolism.  Incidental CT findings: None.  IMPRESSION: 1. No evidence of metastatic disease. 2.  Aortic atherosclerosis (ICD10-170.0).      Impression and Plan:  62 year old woman with:  1.  Melanoma of the right forearm diagnosed in March 2017. He is status post a wide excision and sentinel lymph node sampling followed by axillary dissection with 1 out of 20 lymph nodes showed minimal disease.  She remains on active surveillance without any evidence of recurrent disease.  Her PET CT scan obtained on 11/24/2017 was personally reviewed and showed no evidence of any disease recurrence.  The natural course of this disease and risk of recurrence were discussed as well as treatment options.  At this time plan is to continue with active surveillance with a repeat physical exam 6 months and repeat imaging  studies annually.  She is agreeable with this plan and will repeat laboratory testing and physical exam in 6 months.  2.  Systemic lupus: No recent flare noted at this time.  She remains on Plaquenil.  3.  Dermatology surveillance: She continues to follow with dermatology every 6 months.  Encouraged her to continue to do so.  4. Follow-up: Will be in 6 months.   15  minutes was spent with the patient face-to-face today.  More than 50% of time was dedicated to reviewing the natural course of her disease, treatment options and coordinating her plan.    Zola Button, MD 9/19/20199:14 AM

## 2017-12-01 NOTE — Telephone Encounter (Signed)
Printed avs and calender of upcoming appointment. Per 9/19 los 

## 2018-04-13 IMAGING — US US EXTREM UP *R* LTD
1 series · 14 of 21 positions shown · non-contrast
Comparison: None

CLINICAL DATA: History of breast cancer, post right axillary
adenectomy, now with symptomatic palpable mass within the right
axilla worrisome for a seroma.

EXAM:
ULTRASOUND RIGHT UPPER EXTREMITY LIMITED
TECHNIQUE: Ultrasound examination of the upper extremity soft tissues was
performed in the area of clinical concern.

[Series 1: us extrem up *right* ltd · 0.08mm/px · 14 of 21 slices shown]
[im 1/21]
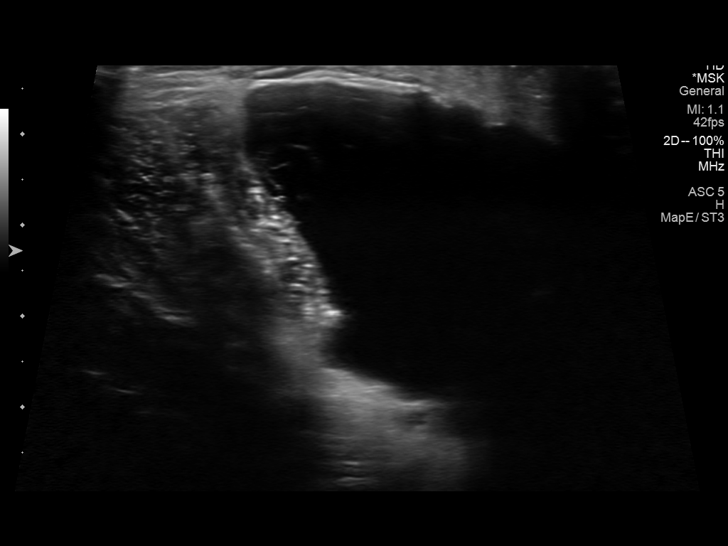
[im 3/21]
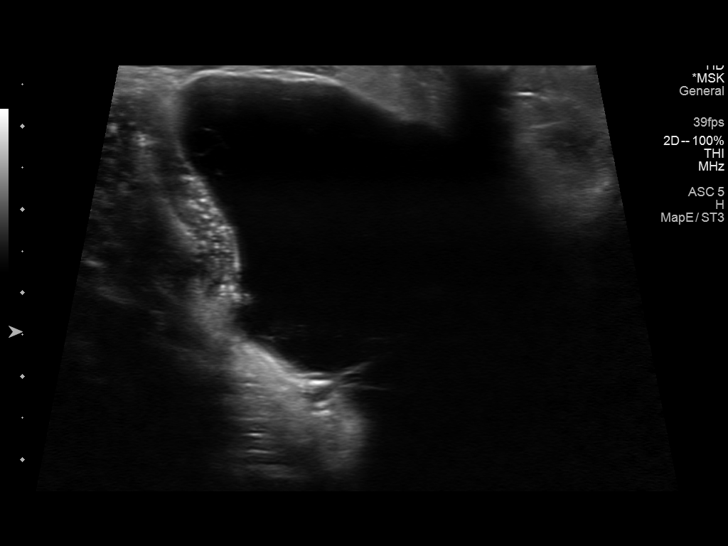
[im 4/21]
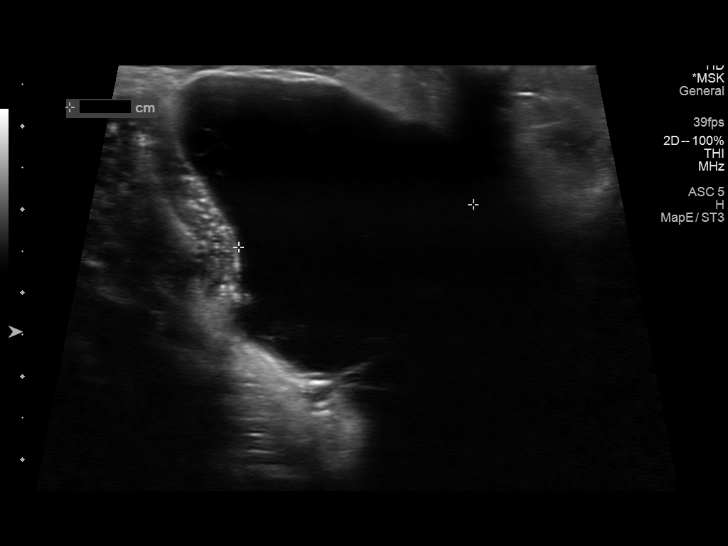
[im 6/21]
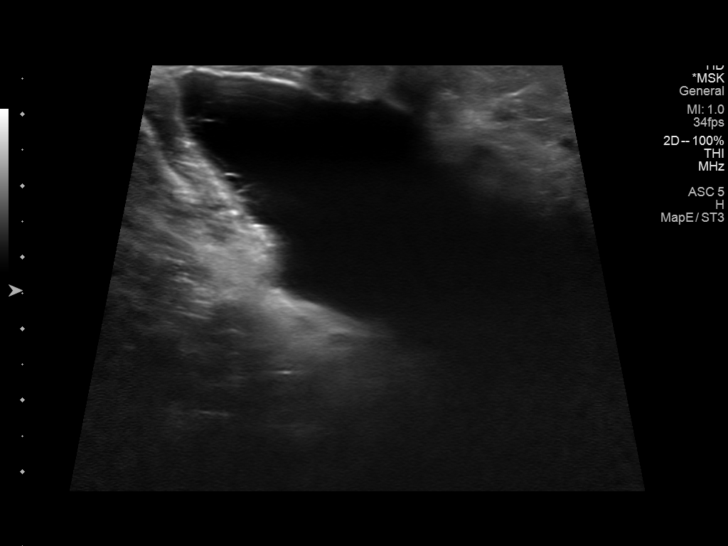
[im 7/21]
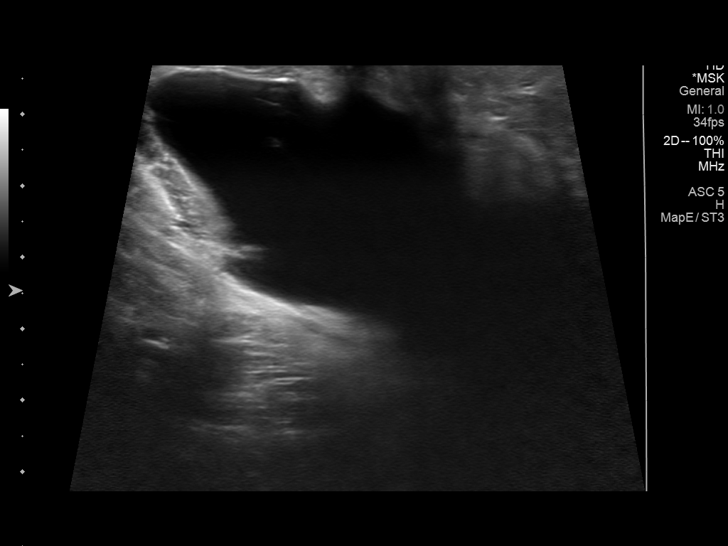
[im 9/21]
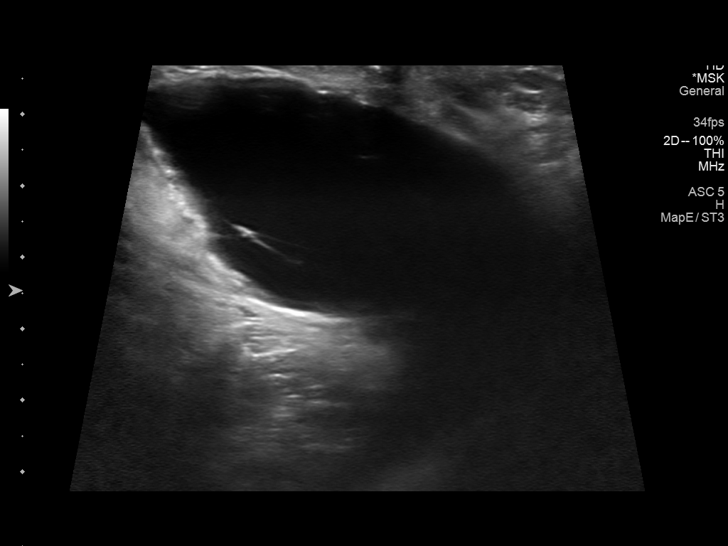
[im 10/21]
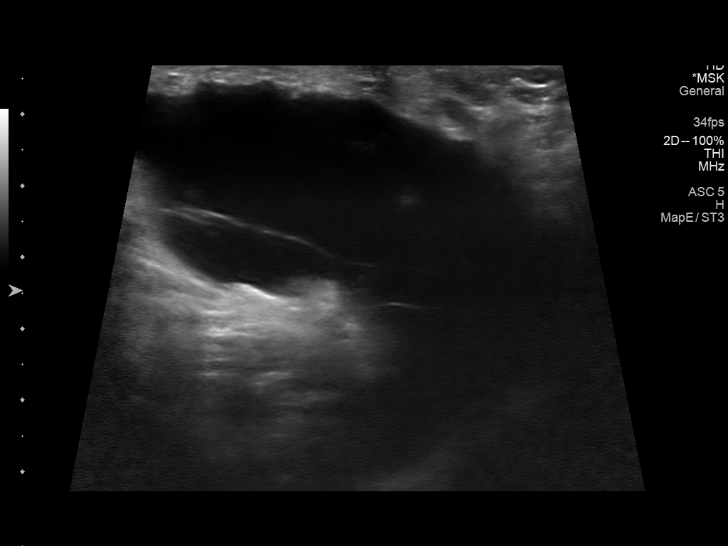
[im 12/21]
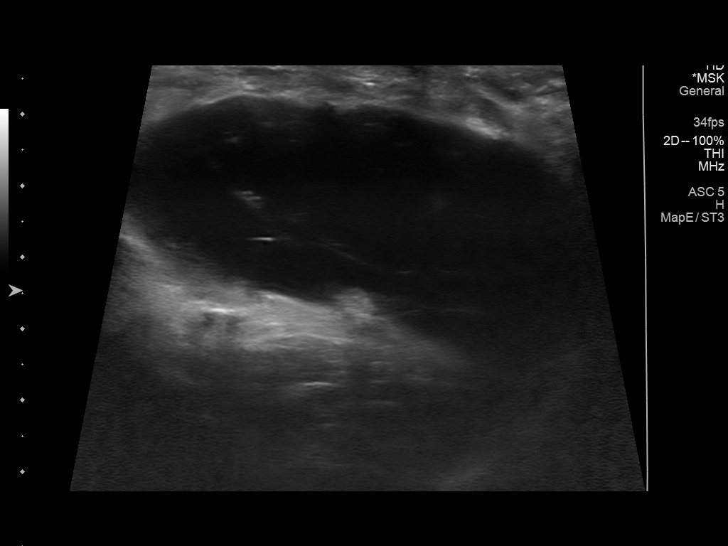
[im 13/21]
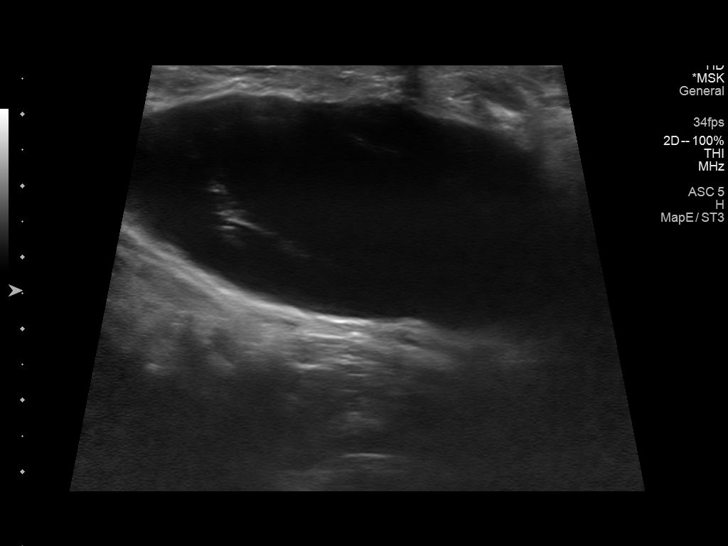
[im 15/21]
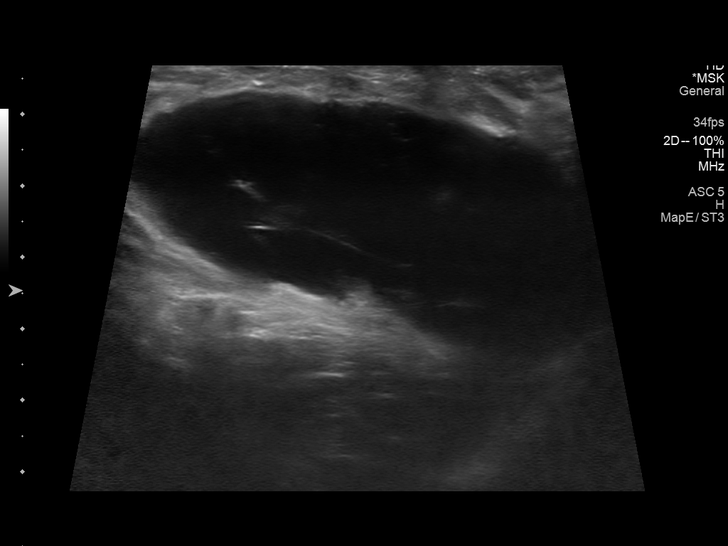
[im 16/21]
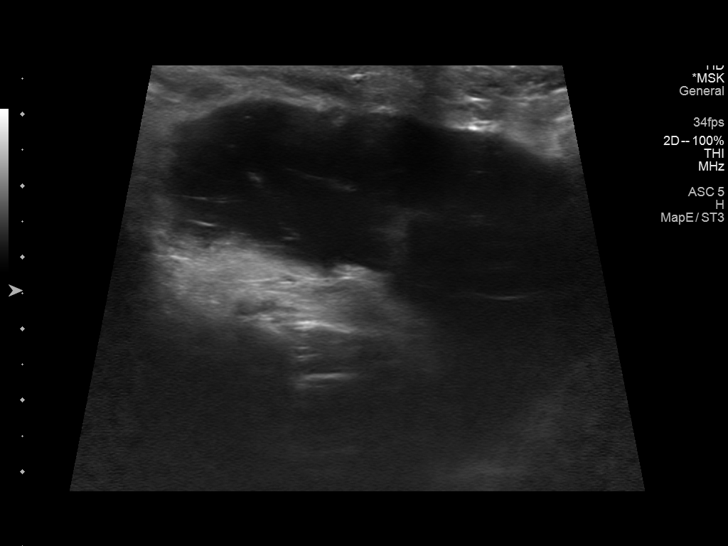
[im 18/21]
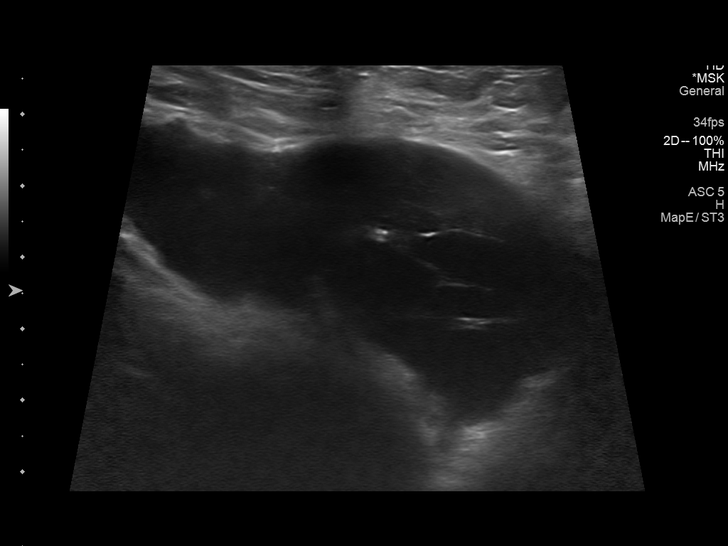
[im 19/21]
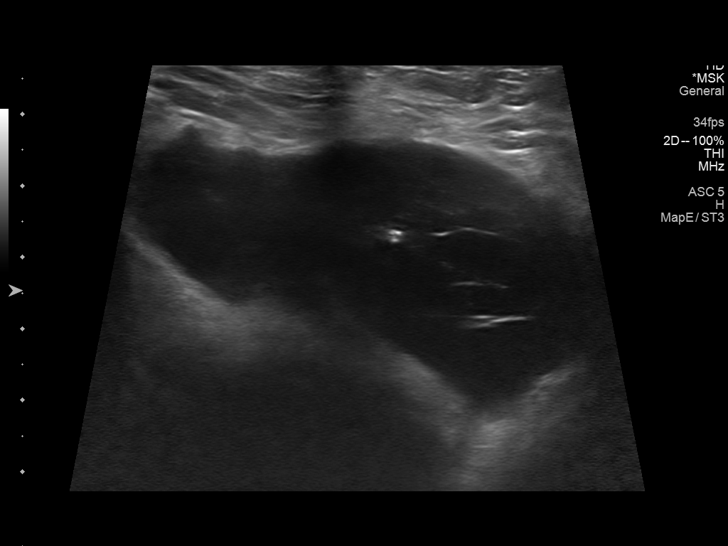
[im 21/21]
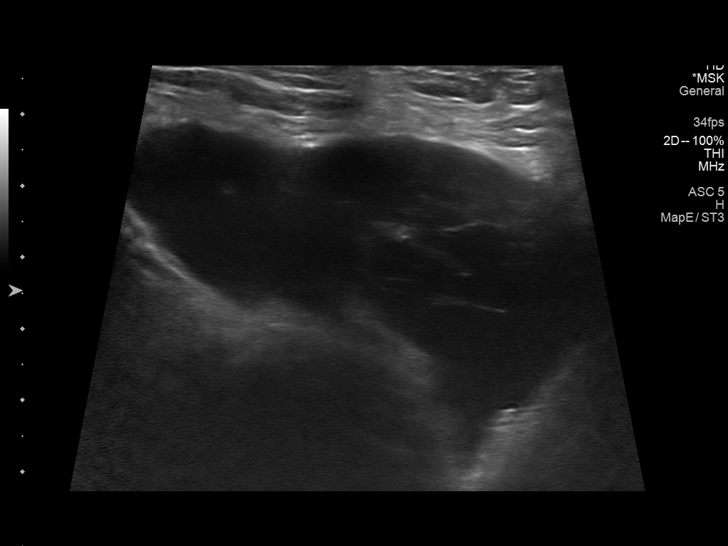

[14 of 21 positions shown; findings below may reference images not displayed]

FINDINGS: Provided grayscale images demonstrate an approximately 5.8 x 3.3 x
2.9 cm fluid collection with minimal internal echogenic septations.
IMPRESSION: Approximately 5.8 cm fluid collection within the right axilla
correlates with the patient's palpable area of concern. While
differential considerations are broad, given history of recent
axillary lymph node dissection and lack of infectious symptoms, this
collection is favored to represent a seroma.

Note, this fluid collection subsequently underwent ultrasound-guided
percutaneous drainage catheter placement.

## 2018-05-30 ENCOUNTER — Other Ambulatory Visit: Payer: Self-pay | Admitting: Oncology

## 2018-05-30 DIAGNOSIS — C4361 Malignant melanoma of right upper limb, including shoulder: Secondary | ICD-10-CM

## 2018-05-30 NOTE — Progress Notes (Signed)
I called Lauren Mccoy today to discuss any questions or concerns regarding her upcoming visit to the cancer center.  Continues to feel about the same without any major changes.  Given the routine nature of her active surveillance at this time, I have recommended postponing her visit for 6 months when she is due for her imaging studies.  She is comfortable with this arrangement and she is to reach out in the interim of any issues that arise.  We will set up a PET scan and an follow-up in September 2020.  All her questions were answered today.

## 2018-06-01 ENCOUNTER — Telehealth: Payer: Self-pay | Admitting: Oncology

## 2018-06-01 ENCOUNTER — Inpatient Hospital Stay

## 2018-06-01 ENCOUNTER — Inpatient Hospital Stay: Admitting: Oncology

## 2018-06-01 NOTE — Telephone Encounter (Signed)
Scheduled appt per 3/17 sch message - sent reminder letter in the mail with appt date and time

## 2018-06-06 ENCOUNTER — Telehealth: Payer: Self-pay | Admitting: Oncology

## 2018-06-06 NOTE — Telephone Encounter (Signed)
Called pt per voicemail msg. Spoke with pt. Wants to cancel appts in September. Pt does not know if she will call and reschedule.

## 2018-08-21 ENCOUNTER — Encounter: Payer: Self-pay | Admitting: Oncology

## 2018-08-21 ENCOUNTER — Telehealth: Payer: Self-pay | Admitting: Oncology

## 2018-08-21 NOTE — Telephone Encounter (Signed)
I attempted to call the pt to r/s appt w/Dr. Alen Blew. VM on cell phone is full and I was able to lv a vm for her to return my call to schedule.

## 2018-08-21 NOTE — Telephone Encounter (Signed)
Scheduled pt to see Dr. Alen Blew on 9/16 at 9am and a lab appt on 9/10 at 51am. Will mail a letter and calendar.

## 2018-08-22 ENCOUNTER — Telehealth: Payer: Self-pay | Admitting: Oncology

## 2018-08-22 NOTE — Telephone Encounter (Signed)
Pt cld to reschedule lab appt w/Dr. Alen Blew to the same day as her appt on 9/16 at 10 followed by appt w/Dr. Tera Partridge 1030am.

## 2018-11-16 ENCOUNTER — Other Ambulatory Visit

## 2018-11-23 ENCOUNTER — Ambulatory Visit: Admitting: Oncology

## 2018-11-23 ENCOUNTER — Other Ambulatory Visit: Payer: Self-pay | Admitting: Oncology

## 2018-11-23 ENCOUNTER — Other Ambulatory Visit: Payer: Self-pay

## 2018-11-23 ENCOUNTER — Encounter (HOSPITAL_COMMUNITY)
Admission: RE | Admit: 2018-11-23 | Discharge: 2018-11-23 | Disposition: A | Discharge status: Home / Self Care | Source: Ambulatory Visit | Attending: Oncology | Admitting: Oncology

## 2018-11-23 ENCOUNTER — Other Ambulatory Visit

## 2018-11-23 DIAGNOSIS — C4361 Malignant melanoma of right upper limb, including shoulder: Secondary | ICD-10-CM | POA: Insufficient documentation

## 2018-11-23 LAB — GLUCOSE, CAPILLARY: Glucose-Capillary: 88 mg/dL (ref 70–99)

## 2018-11-23 MED ORDER — FLUDEOXYGLUCOSE F - 18 (FDG) INJECTION
5.7800 | Freq: Once | INTRAVENOUS | Status: AC | PRN
  Administered 2018-11-23: 5.78 via INTRAVENOUS

## 2018-11-29 ENCOUNTER — Inpatient Hospital Stay: Attending: Oncology

## 2018-11-29 ENCOUNTER — Other Ambulatory Visit: Payer: Self-pay

## 2018-11-29 ENCOUNTER — Ambulatory Visit: Admitting: Oncology

## 2018-11-29 ENCOUNTER — Inpatient Hospital Stay (HOSPITAL_BASED_OUTPATIENT_CLINIC_OR_DEPARTMENT_OTHER): Admitting: Oncology

## 2018-11-29 VITALS — BP 124/77 | HR 84 | Temp 98.2°F | Resp 18 | Wt 116.8 lb

## 2018-11-29 DIAGNOSIS — Z85820 Personal history of malignant melanoma of skin: Secondary | ICD-10-CM | POA: Insufficient documentation

## 2018-11-29 DIAGNOSIS — M329 Systemic lupus erythematosus, unspecified: Secondary | ICD-10-CM | POA: Diagnosis not present

## 2018-11-29 DIAGNOSIS — C4361 Malignant melanoma of right upper limb, including shoulder: Secondary | ICD-10-CM

## 2018-11-29 DIAGNOSIS — Z7982 Long term (current) use of aspirin: Secondary | ICD-10-CM | POA: Insufficient documentation

## 2018-11-29 DIAGNOSIS — Z79899 Other long term (current) drug therapy: Secondary | ICD-10-CM | POA: Insufficient documentation

## 2018-11-29 LAB — CBC WITH DIFFERENTIAL (CANCER CENTER ONLY)
Abs Immature Granulocytes: 0.02 10*3/uL (ref 0.00–0.07)
Basophils Absolute: 0 10*3/uL (ref 0.0–0.1)
Basophils Relative: 1 %
Eosinophils Absolute: 0.1 10*3/uL (ref 0.0–0.5)
Eosinophils Relative: 2 %
HCT: 39.5 % (ref 36.0–46.0)
Hemoglobin: 13.2 g/dL (ref 12.0–15.0)
Immature Granulocytes: 0 %
Lymphocytes Relative: 19 %
Lymphs Abs: 0.9 10*3/uL (ref 0.7–4.0)
MCH: 32.2 pg (ref 26.0–34.0)
MCHC: 33.4 g/dL (ref 30.0–36.0)
MCV: 96.3 fL (ref 80.0–100.0)
Monocytes Absolute: 0.6 10*3/uL (ref 0.1–1.0)
Monocytes Relative: 13 %
Neutro Abs: 2.9 10*3/uL (ref 1.7–7.7)
Neutrophils Relative %: 65 %
Platelet Count: 226 10*3/uL (ref 150–400)
RBC: 4.1 MIL/uL (ref 3.87–5.11)
RDW: 13.5 % (ref 11.5–15.5)
WBC Count: 4.5 10*3/uL (ref 4.0–10.5)
nRBC: 0 % (ref 0.0–0.2)

## 2018-11-29 LAB — CMP (CANCER CENTER ONLY)
ALT: 9 U/L (ref 0–44)
AST: 19 U/L (ref 15–41)
Albumin: 4.2 g/dL (ref 3.5–5.0)
Alkaline Phosphatase: 56 U/L (ref 38–126)
Anion gap: 7 (ref 5–15)
BUN: 16 mg/dL (ref 8–23)
CO2: 28 mmol/L (ref 22–32)
Calcium: 8.9 mg/dL (ref 8.9–10.3)
Chloride: 105 mmol/L (ref 98–111)
Creatinine: 0.74 mg/dL (ref 0.44–1.00)
GFR, Est AFR Am: >60 mL/min (ref >60)
GFR, Estimated: >60 mL/min (ref >60)
Glucose, Bld: 87 mg/dL (ref 70–99)
Potassium: 4.1 mmol/L (ref 3.5–5.1)
Sodium: 140 mmol/L (ref 135–145)
Total Bilirubin: 0.3 mg/dL (ref 0.3–1.2)
Total Protein: 6.5 g/dL (ref 6.5–8.1)

## 2018-11-29 NOTE — Progress Notes (Signed)
Hematology and Oncology Follow Up Visit  Lauren Mccoy [id number] 01-Feb-1956 63 y.o. 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  She was found to have 1.2 mm depth of invasion with 1 out of 20 lymph node involved.    Prior Therapy: She is status post wide excision and sentinel lymph node sampling which showed T2aN1a disease after complete axillary lymph node dissection showed no other involvement in 20 lymph nodes sampled. She did have a positive sentinel lymph node biopsy with supper capsular a total deposit was 1.1 x 0.3 mm  Current therapy: Active surveillance.  Interim History: Lauren Mccoy is here for return evaluation.  Since the last visit, she reports no major changes in her health.  She denied any recent hospitalizations or illnesses.  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  She has reported her lupus has been flaring as well as pain with sciatica.  She denies any have constitutional symptoms or weight loss.  Patient denied any alteration mental status, neuropathy, confusion or dizziness.  Denies any headaches or lethargy.  Denies any night sweats, weight loss or changes in appetite.  Denied orthopnea, dyspnea on exertion or chest discomfort.  Denies shortness of breath, difficulty breathing hemoptysis or cough.  Denies any abdominal distention, nausea, early satiety or dyspepsia.  Denies any hematuria, frequency, dysuria or nocturia.  Denies any skin irritation, dryness or rash.  Denies any ecchymosis or petechiae.  Denies any lymphadenopathy or clotting.  Denies any heat or cold intolerance.  Denies any anxiety or depression.  Remaining review of system is negative.       Medications: Updated on review. Current Outpatient Medications  Medication Sig Dispense Refill  . ALPRAZolam (XANAX) 0.5 MG tablet TAKE 1/2 TO 1 TABLET 3  TIMES A DAY AS NEEDED FOR ANXIETY  0  . aspirin-acetaminophen-caffeine (EXCEDRIN MIGRAINE) 250-250-65 MG tablet Take 2 tablets by mouth daily as needed for headache or migraine.    . Cholecalciferol (VITAMIN D) 2000 units CAPS Take 2,000 Units by mouth daily.    . folic acid (FOLVITE) 1 MG tablet Take 2 mg by mouth daily.     . hydroxychloroquine (PLAQUENIL) 200 MG tablet Take 200 mg by mouth 2 (two) times daily.    . magic mouthwash SOLN SWISH 1 TO 2 TEASPOONS BY MOUTH TWICE DAILY AS NEEDED FOR MOUTH SORES  12  . methotrexate (RHEUMATREX) 2.5 MG tablet Take 22.5 mg by mouth once a week. Caution:Chemotherapy. Protect from light.--  Pt takes on friday's    . POTASSIUM PO Take 595 mg by mouth daily.    . Probiotic Product (HEALTHY COLON PO) Take 1 tablet by mouth daily.    . ranitidine (ZANTAC) 150 MG tablet Take 150 mg by mouth 2 (two) times daily as needed for heartburn.    Marland Kitchen SYNTHROID 88 MCG tablet Take 1 tablet by mouth daily.  5  . vitamin E 400 UNIT capsule Take 400 Units by mouth daily.     No current facility-administered medications for this visit.      Allergies: No Known Allergies  Past Medical History, Surgical history, Social history, and Family History without changes on review.   Physical Exam:  Blood pressure 124/77, pulse 84, temperature 98.2 F (36.8 C), temperature source Oral, resp. rate 18, weight 116 lb 12.8 oz (53 kg), SpO2 100 %.   ECOG:  0    General appearance: Comfortable appearing without any discomfort Head: Normocephalic without any trauma Oropharynx: Mucous membranes are moist and pink without any thrush or ulcers. Eyes: Pupils are equal and round reactive to light. Lymph nodes: No cervical, supraclavicular, inguinal or axillary lymphadenopathy.   Heart:regular rate and rhythm.  S1 and S2 without leg edema. Lung: Clear without any rhonchi or wheezes.  No dullness to percussion. Abdomin: Soft, nontender, nondistended with good bowel sounds.  No  hepatosplenomegaly. Musculoskeletal: No joint deformity or effusion.  Full range of motion noted. Neurological: No deficits noted on motor, sensory and deep tendon reflex exam.    CBC    Component Value Date/Time   WBC 4.3 12/01/2017 0852   WBC 4.0 06/01/2017 0959   RBC 3.99 12/01/2017 0852   HGB 13.1 12/01/2017 0852   HCT 38.6 12/01/2017 0852   PLT 227 12/01/2017 0852   MCV 96.8 12/01/2017 0852   MCH 32.8 12/01/2017 0852   MCHC 33.9 12/01/2017 0852   RDW 14.6 (H) 12/01/2017 0852   LYMPHSABS 0.8 (L) 12/01/2017 0852   MONOABS 0.6 12/01/2017 0852   EOSABS 0.0 12/01/2017 0852   BASOSABS 0.0 12/01/2017 0852   EXAM: NUCLEAR MEDICINE PET WHOLE BODY  TECHNIQUE: 5.8 mCi F-18 FDG was injected intravenously. Full-ring PET imaging was performed from the skull base to thigh after the radiotracer. CT data was obtained and used for attenuation correction and anatomic localization.  Fasting blood glucose: 88 mg/dl  COMPARISON:  11/24/2017  FINDINGS: Mediastinal blood pool activity: SUV max 1.7  HEAD/NECK: No hypermetabolic activity in the scalp. No hypermetabolic cervical lymph nodes.  Incidental CT findings: none  CHEST: 5 mm short axis right axillary node (83/4) is stable in size in the interval. This shows low level FDG uptake, centrally at blood pool levels today with SUV max = 2.1. Otherwise no hypermetabolic lymphadenopathy in the chest.  Incidental CT findings: Biapical pleuroparenchymal scarring noted. 3 mm perifissural nodule in the right lung (55/8) is stable. No new suspicious pulmonary nodule or mass.  ABDOMEN/PELVIS: No abnormal hypermetabolic activity within the liver, pancreas, adrenal glands, or spleen. No hypermetabolic lymph nodes in the abdomen or pelvis.  Incidental CT findings: Multiple cysts again noted in the liver. There is abdominal aortic atherosclerosis without aneurysm. Irregular calcification in the central small bowel mesentery  is stable in shows no hypermetabolism on PET imaging.  SKELETON: No focal hypermetabolic activity to suggest skeletal metastasis.  Incidental CT findings: none  EXTREMITIES: No abnormal hypermetabolic activity in the lower extremities.  Incidental CT findings: none  IMPRESSION: 1. Stable unenlarged right axillary node shows low level uptake on today's study. Attention on follow-up recommended. 2. No evidence for hypermetabolic metastatic disease in the neck, chest, abdomen, or pelvis. 3.  Aortic Atherosclerois (ICD10-170.0)   Impression and Plan:  63 year old woman with:  1.  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   She continues to be disease-free without any evidence of relapse at this time.  PET CT scan obtained on 11/23/2018 was personally reviewed and showed no evidence of relapse.  The natural course of this disease and risk of relapse was assessed as well as a surveillance schedule was reiterated.  Salvage options in case of relapse was assessed including immunotherapy as well as oral targeted therapy depending on her BRAF status.  He is agreeable to continue with active surveillance and repeat imaging studies in 1 year.  2.  Systemic lupus: Overall stable without any recent exacerbations.  3.  Dermatology surveillance: I recommended to continue with dermatology surveillance at this time.  4. Follow-up: In 12 months for repeat evaluation.  15  minutes was spent with the patient face-to-face today.  More than 50% of time was spent on updating her disease status, treatment options and answering questions regarding future plan of care.    Zola Button, MD 9/16/202010:11 AM

## 2019-11-22 ENCOUNTER — Inpatient Hospital Stay

## 2019-11-22 ENCOUNTER — Ambulatory Visit: Admitting: Oncology

## 2019-11-29 ENCOUNTER — Inpatient Hospital Stay: Payer: Medicare Other | Attending: Oncology | Admitting: Oncology

## 2019-11-29 ENCOUNTER — Other Ambulatory Visit: Payer: Self-pay

## 2019-11-29 ENCOUNTER — Inpatient Hospital Stay: Payer: Medicare Other

## 2019-11-29 VITALS — BP 125/76 | HR 84 | Temp 98.1°F | Resp 20 | Ht 67.0 in | Wt 114.4 lb

## 2019-11-29 DIAGNOSIS — C4361 Malignant melanoma of right upper limb, including shoulder: Secondary | ICD-10-CM

## 2019-11-29 LAB — CMP (CANCER CENTER ONLY)
ALT: 13 U/L (ref 0–44)
AST: 22 U/L (ref 15–41)
Albumin: 4 g/dL (ref 3.5–5.0)
Alkaline Phosphatase: 52 U/L (ref 38–126)
Anion gap: 8 (ref 5–15)
BUN: 13 mg/dL (ref 8–23)
CO2: 27 mmol/L (ref 22–32)
Calcium: 9.3 mg/dL (ref 8.9–10.3)
Chloride: 106 mmol/L (ref 98–111)
Creatinine: 0.75 mg/dL (ref 0.44–1.00)
GFR, Est AFR Am: >60 mL/min (ref >60)
GFR, Estimated: >60 mL/min (ref >60)
Glucose, Bld: 92 mg/dL (ref 70–99)
Potassium: 3.9 mmol/L (ref 3.5–5.1)
Sodium: 141 mmol/L (ref 135–145)
Total Bilirubin: 0.3 mg/dL (ref 0.3–1.2)
Total Protein: 6.7 g/dL (ref 6.5–8.1)

## 2019-11-29 LAB — CBC WITH DIFFERENTIAL (CANCER CENTER ONLY)
Abs Immature Granulocytes: 0.01 10*3/uL (ref 0.00–0.07)
Basophils Absolute: 0 10*3/uL (ref 0.0–0.1)
Basophils Relative: 1 %
Eosinophils Absolute: 0.1 10*3/uL (ref 0.0–0.5)
Eosinophils Relative: 1 %
HCT: 39.2 % (ref 36.0–46.0)
Hemoglobin: 13.1 g/dL (ref 12.0–15.0)
Immature Granulocytes: 0 %
Lymphocytes Relative: 28 %
Lymphs Abs: 1.4 10*3/uL (ref 0.7–4.0)
MCH: 31.3 pg (ref 26.0–34.0)
MCHC: 33.4 g/dL (ref 30.0–36.0)
MCV: 93.6 fL (ref 80.0–100.0)
Monocytes Absolute: 0.6 10*3/uL (ref 0.1–1.0)
Monocytes Relative: 12 %
Neutro Abs: 3 10*3/uL (ref 1.7–7.7)
Neutrophils Relative %: 58 %
Platelet Count: 263 10*3/uL (ref 150–400)
RBC: 4.19 MIL/uL (ref 3.87–5.11)
RDW: 13.7 % (ref 11.5–15.5)
WBC Count: 5.1 10*3/uL (ref 4.0–10.5)
nRBC: 0 % (ref 0.0–0.2)

## 2019-11-29 NOTE — Progress Notes (Signed)
Hematology and Oncology Follow Up Visit  Lauren Mccoy [id number] 24-Jun-1955 64 y.o. 11/29/2019 10:03 AM Lauren Mccoy, MDShaw, Lauren May, MD   Principle Diagnosis: 64 year old woman with melanoma of the right forearm presented with T2aN1, 1.2 mm depth of invasion with 1 out of 20 lymph node involved in 2017.  Prior Therapy: She is status post wide excision and sentinel lymph node sampling which showed T2aN1a disease after complete axillary lymph node dissection showed no other involvement in 20 lymph nodes sampled. She did have a positive sentinel lymph node biopsy with supper capsular a total deposit was 1.1 x 0.3 mm  Current therapy: Active surveillance.  Interim History: Lauren Mccoy returns today for a follow-up visit.  Since the last visit, she reports no major changes in her health.  Continues to follow with dermatology without any recent skin rashes or lesions.  Performance status quality of life remains unchanged.       Medications: Unchanged on review. Current Outpatient Medications  Medication Sig Dispense Refill  . ALPRAZolam (XANAX) 0.5 MG tablet TAKE 1/2 TO 1 TABLET 3 TIMES A DAY AS NEEDED FOR ANXIETY  0  . aspirin-acetaminophen-caffeine (EXCEDRIN MIGRAINE) 250-250-65 MG tablet Take 2 tablets by mouth daily as needed for headache or migraine.    . Cholecalciferol (VITAMIN D) 2000 units CAPS Take 2,000 Units by mouth daily.    . folic acid (FOLVITE) 1 MG tablet Take 2 mg by mouth daily.     . hydroxychloroquine (PLAQUENIL) 200 MG tablet Take 200 mg by mouth 2 (two) times daily.    . magic mouthwash SOLN SWISH 1 TO 2 TEASPOONS BY MOUTH TWICE DAILY AS NEEDED FOR MOUTH SORES  12  . methotrexate (RHEUMATREX) 2.5 MG tablet Take 22.5 mg by mouth once a week. Caution:Chemotherapy. Protect from light.--  Pt takes on friday's    . POTASSIUM PO Take 595 mg by mouth daily.    . Probiotic Product (HEALTHY COLON PO) Take 1 tablet by mouth daily.    . ranitidine (ZANTAC) 150 MG tablet  Take 150 mg by mouth 2 (two) times daily as needed for heartburn.    Marland Kitchen SYNTHROID 88 MCG tablet Take 1 tablet by mouth daily.  5  . vitamin E 400 UNIT capsule Take 400 Units by mouth daily.     No current facility-administered medications for this visit.     Allergies: No Known Allergies     Physical Exam:  Blood pressure 125/76, pulse 84, temperature 98.1 F (36.7 C), temperature source Tympanic, resp. rate 20, height 5\' 7"  (1.702 m), weight 114 lb 6.4 oz (51.9 kg), SpO2 100 %.    ECOG:  0   General appearance: Alert, awake without any distress. Head: Atraumatic without abnormalities Oropharynx: Without any thrush or ulcers. Eyes: No scleral icterus. Lymph nodes: No lymphadenopathy noted in the cervical, supraclavicular, or axillary nodes Heart:regular rate and rhythm, without any murmurs or gallops.   Lung: Clear to auscultation without any rhonchi, wheezes or dullness to percussion. Abdomin: Soft, nontender without any shifting dullness or ascites. Musculoskeletal: No clubbing or cyanosis. Neurological: No motor or sensory deficits. Skin: No rashes or lesions.     CBC    Component Value Date/Time   WBC 4.5 11/29/2018 1006   WBC 4.0 06/01/2017 0959   RBC 4.10 11/29/2018 1006   HGB 13.2 11/29/2018 1006   HCT 39.5 11/29/2018 1006   PLT 226 11/29/2018 1006   MCV 96.3 11/29/2018 1006   MCH 32.2 11/29/2018 1006   MCHC  33.4 11/29/2018 1006   RDW 13.5 11/29/2018 1006   LYMPHSABS 0.9 11/29/2018 1006   MONOABS 0.6 11/29/2018 1006   EOSABS 0.1 11/29/2018 1006   BASOSABS 0.0 11/29/2018 1006   E IMPRESSION: 1. Stable unenlarged right axillary node shows low level uptake on today's study. Attention on follow-up recommended. 2. No evidence for hypermetabolic metastatic disease in the neck, chest, abdomen, or pelvis. 3.  Aortic Atherosclerois (ICD10-170.0)   Impression and Plan:  64 year old woman with:  1.  Melanoma of the right forearm diagnosed in March  2017.  She presented with stage III and microscopic involvement in 1 out of 20 lymph nodes.   She continues to be on active surveillance without any evidence of disease relapse at this time.  Risks and benefits of continuing this approach were discussed and pet imaging from September 2020 showed no evidence of disease.     Risk of relapse was assessed today and currently is very low.  I recommended no further imaging studies and have MD follow-up in 1 year.  She will continue dermatology surveillance every 6 months.  2.  Systemic lupus: No recent exacerbation.  3.  Dermatology surveillance: She continues to follow with dermatology regularly.  4. Follow-up: In 1 year for repeat follow-up.  20  minutes were dedicated to this visit. The time was spent on reviewing laboratory data, imaging studies, discussing treatment options, and answering questions regarding future plan.     Lauren Button, MD 9/16/202110:03 AM

## 2020-12-15 ENCOUNTER — Other Ambulatory Visit: Payer: Self-pay | Admitting: Family Medicine

## 2020-12-15 DIAGNOSIS — R634 Abnormal weight loss: Secondary | ICD-10-CM

## 2020-12-15 DIAGNOSIS — C4360 Malignant melanoma of unspecified upper limb, including shoulder: Secondary | ICD-10-CM

## 2020-12-19 ENCOUNTER — Ambulatory Visit
Admission: RE | Admit: 2020-12-19 | Discharge: 2020-12-19 | Disposition: A | Discharge status: Home / Self Care | Payer: Medicare Other | Source: Ambulatory Visit | Attending: Family Medicine | Admitting: Family Medicine

## 2020-12-19 DIAGNOSIS — C4360 Malignant melanoma of unspecified upper limb, including shoulder: Secondary | ICD-10-CM

## 2020-12-19 DIAGNOSIS — R634 Abnormal weight loss: Secondary | ICD-10-CM

## 2020-12-19 MED ORDER — IOPAMIDOL (ISOVUE-300) INJECTION 61%
100.0000 mL | Freq: Once | INTRAVENOUS | Status: AC | PRN
  Administered 2020-12-19: 100 mL via INTRAVENOUS

## 2021-07-30 IMAGING — PT NM PET IMAGE RESTAGE (PS) WHOLE BODY
7 of 8 series · 23 of 25 positions shown · non-contrast
Comparison: 11/24/2017

CLINICAL DATA: Subsequent treatment strategy for melanoma.

EXAM:
NUCLEAR MEDICINE PET WHOLE BODY
TECHNIQUE: 5.8 mCi F-18 FDG was injected intravenously. Full-ring PET imaging
was performed from the skull base to thigh after the radiotracer. CT
data was obtained and used for attenuation correction and anatomic
localization.
Fasting blood glucose: 88 mg/dl

[Series 3: pet wb ac · axial · 5.0mm · 4.07mm/px · z∈[+127,+1863]mm · 5 of 435 slices shown]
[im 1/435]
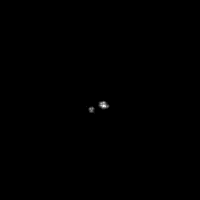
[im 109/435]
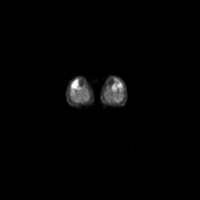
[im 218/435]
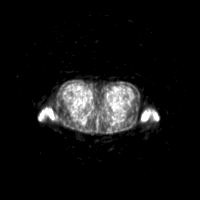
[im 326/435]
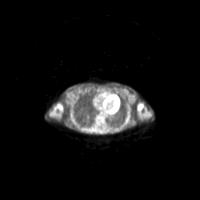
[im 435/435]
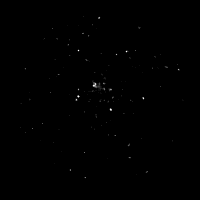

[Series 4: ct wb 5.0 b31f · axial · 5.0mm · 0.98mm/px · z∈[+127,+1863]mm · 4 of 434 slices shown]
[im 1/434  soft-tissue]
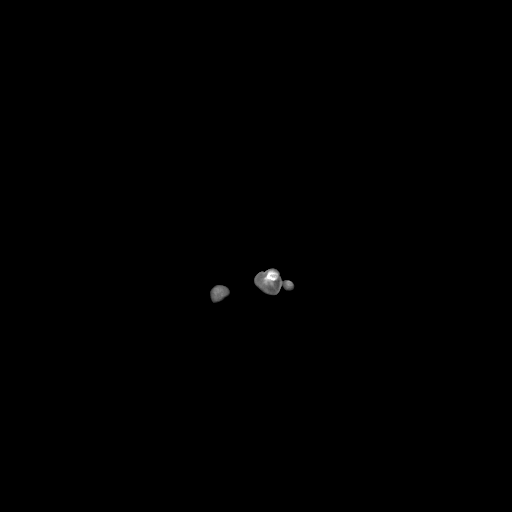
[im 217/434  soft-tissue]
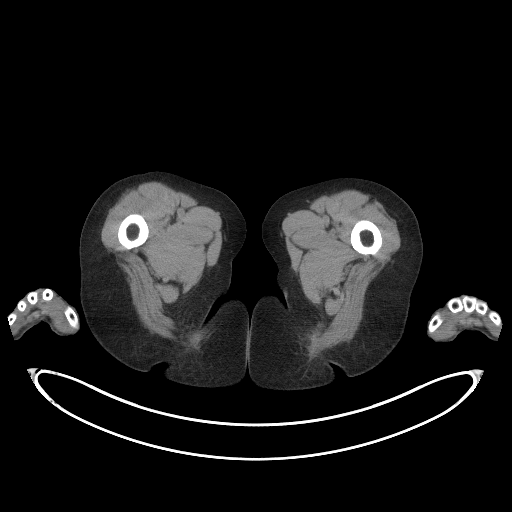
[im 325/434  soft-tissue]
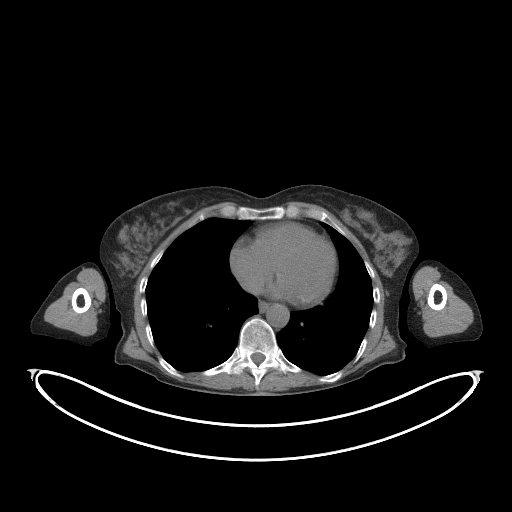
[im 434/434  soft-tissue]
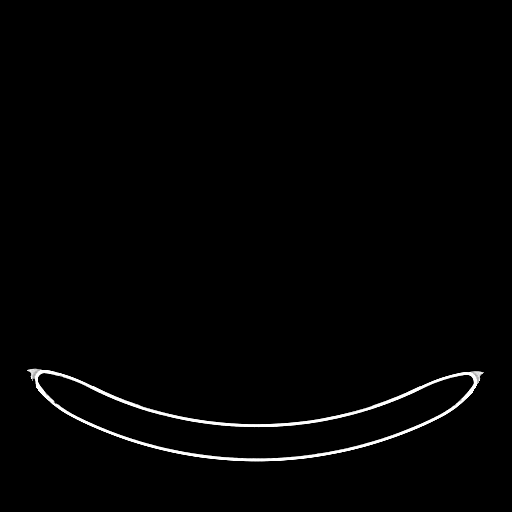

[Series 5: pet wb nac · axial · 5.0mm · 4.07mm/px · z∈[+127,+1863]mm · 6 of 435 slices shown]
[im 1/435]
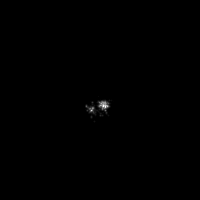
[im 87/435]
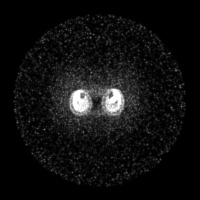
[im 174/435]
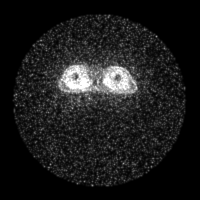
[im 261/435]
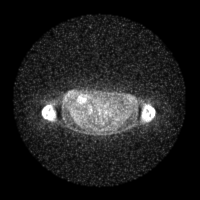
[im 348/435]
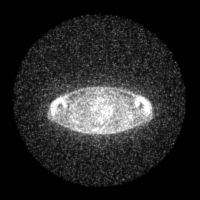
[im 435/435]
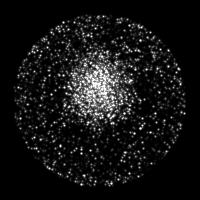

[Series 8: ct wb 5.0 (id) lung_bone · axial · 5.0mm · 0.59mm/px · 1 of 75 slices shown]
[im 1/75  lung]
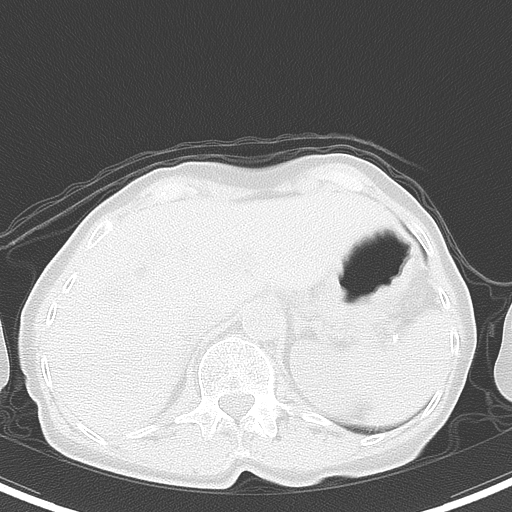

[Series 603: range-ct wb 5.0 (id)<alpha range> · 1 of 66 slices shown (1 of 2)]
[im 1/66]
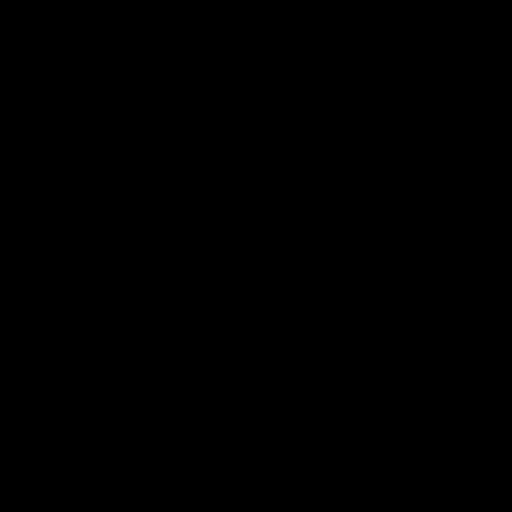

[Series 605: range-ct wb 5.0 (id)<alpha range> · 5 of 408 slices shown (2 of 2)]
[im 1/408]
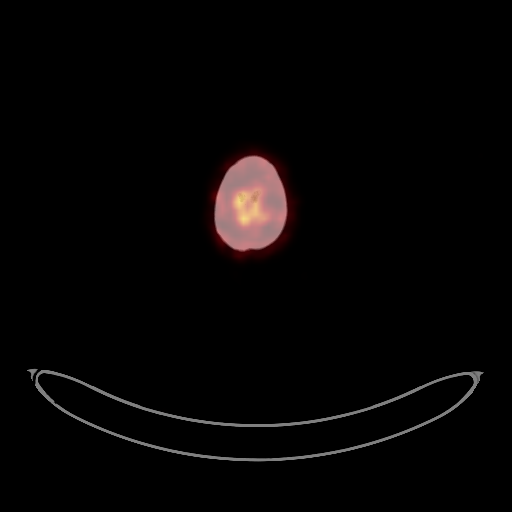
[im 102/408]
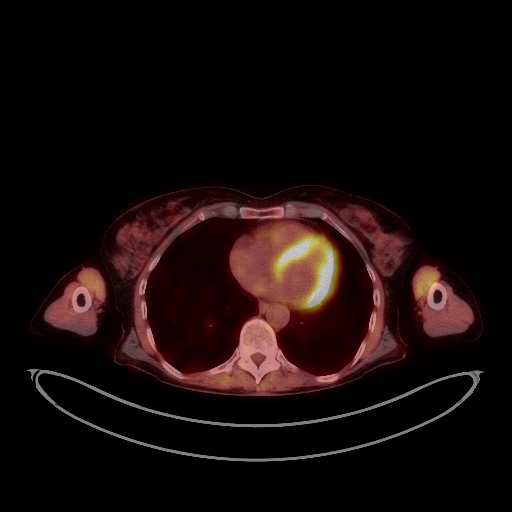
[im 204/408]
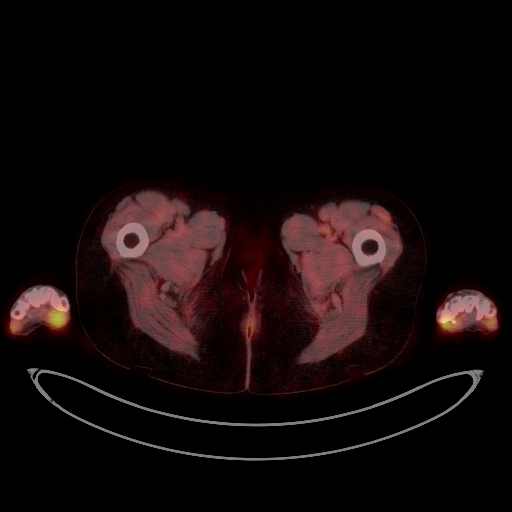
[im 306/408]
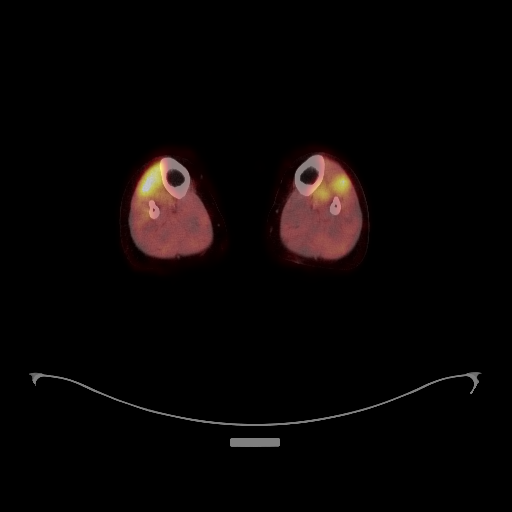
[im 408/408]
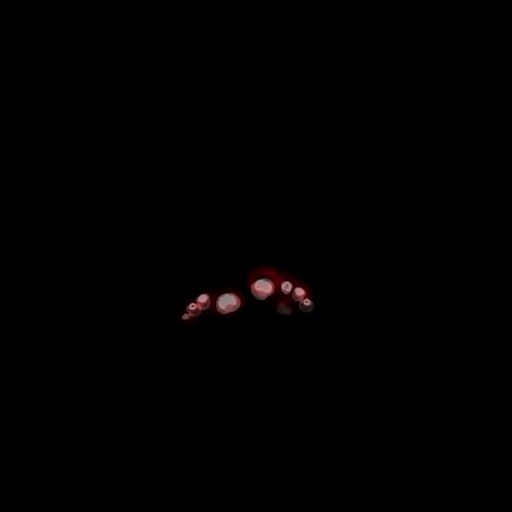

[Series 1640: results mm oncology reading · 1.0mm · 0.50mm/px · 1 of 2 slices shown]
[im 1/2]
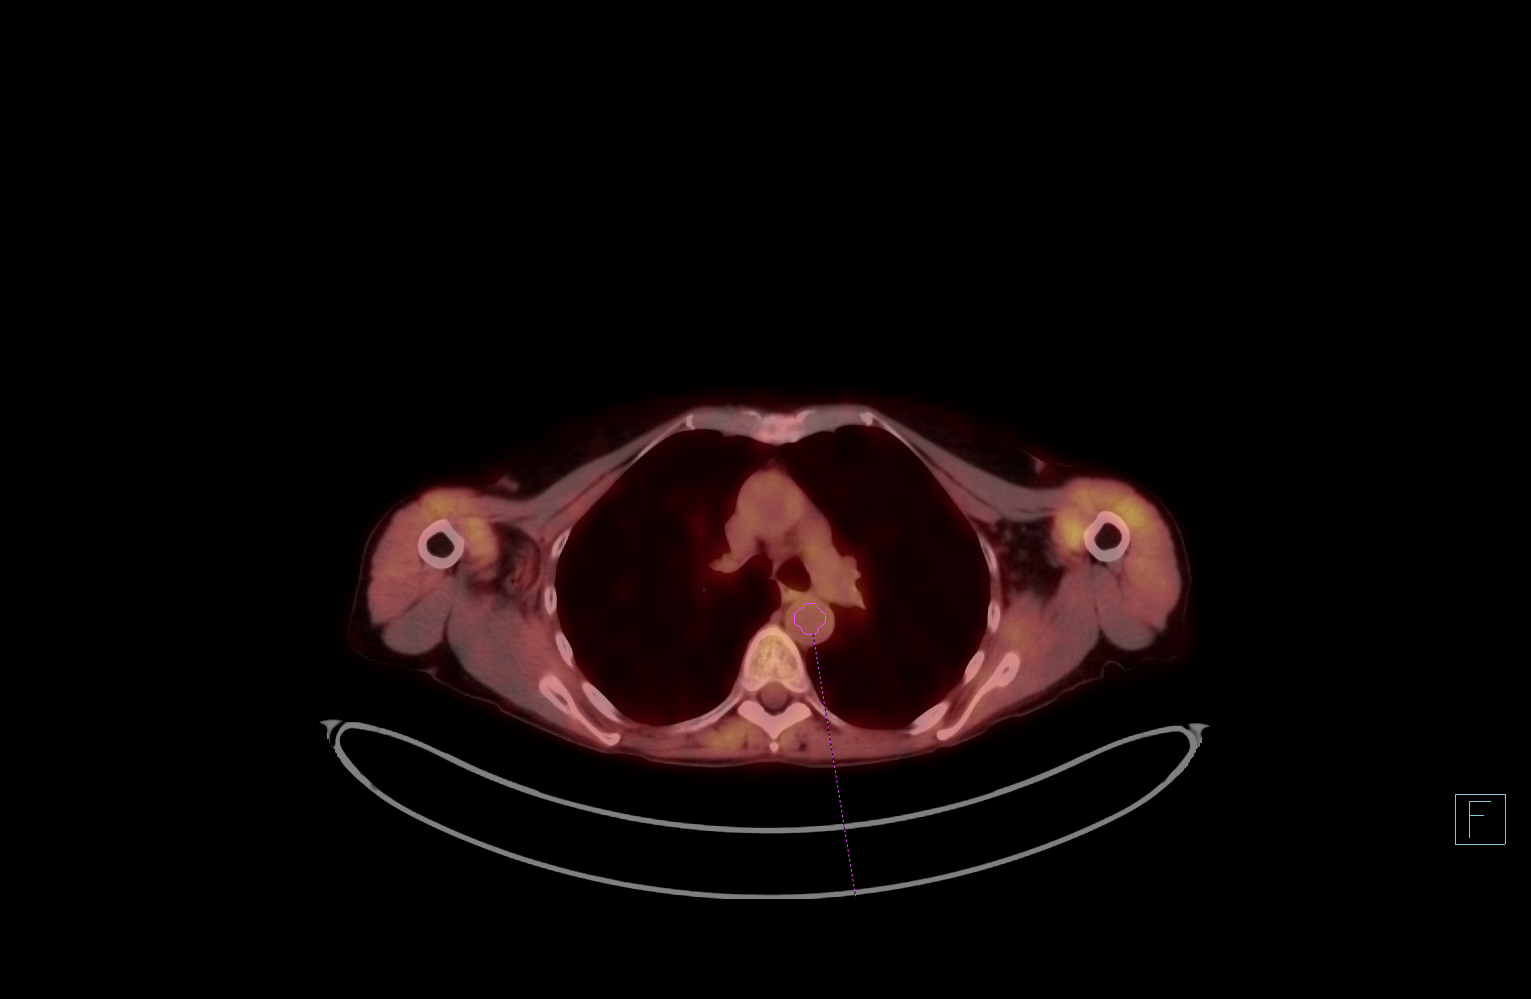

[23 of 25 positions shown; findings below may reference images not displayed]

FINDINGS: Mediastinal blood pool activity: SUV max

HEAD/NECK: No hypermetabolic activity in the scalp. No
hypermetabolic cervical lymph nodes.

Incidental CT findings: none

CHEST: 5 mm short axis right axillary node (83/4) is stable in size
in the interval. This shows low level FDG uptake, centrally at blood
pool levels today with SUV max = 2.1. Otherwise no hypermetabolic
lymphadenopathy in the chest.

Incidental CT findings: Biapical pleuroparenchymal scarring noted. 3
mm perifissural nodule in the right lung (55/8) is stable. No new
suspicious pulmonary nodule or mass.

ABDOMEN/PELVIS: No abnormal hypermetabolic activity within the
liver, pancreas, adrenal glands, or spleen. No hypermetabolic lymph
nodes in the abdomen or pelvis.

Incidental CT findings: Multiple cysts again noted in the liver.
There is abdominal aortic atherosclerosis without aneurysm.
Irregular calcification in the central small bowel mesentery is
stable in shows no hypermetabolism on PET imaging.

SKELETON: No focal hypermetabolic activity to suggest skeletal
metastasis.

Incidental CT findings: none

EXTREMITIES: No abnormal hypermetabolic activity in the lower
extremities.

Incidental CT findings: none
IMPRESSION: 1. Stable unenlarged right axillary node shows low level uptake on
today's study. Attention on follow-up recommended.
2. No evidence for hypermetabolic metastatic disease in the neck,
chest, abdomen, or pelvis.
3.  Aortic Atherosclerois (MY9IS-170.0)

## 2022-07-23 ENCOUNTER — Other Ambulatory Visit (HOSPITAL_COMMUNITY): Payer: Self-pay | Admitting: Family Medicine

## 2022-07-23 ENCOUNTER — Ambulatory Visit
Admission: RE | Admit: 2022-07-23 | Discharge: 2022-07-23 | Disposition: A | Discharge status: Home / Self Care | Payer: Medicare Other | Source: Ambulatory Visit | Attending: Family Medicine | Admitting: Family Medicine

## 2022-07-23 ENCOUNTER — Other Ambulatory Visit: Payer: Self-pay | Admitting: Family Medicine

## 2022-07-23 DIAGNOSIS — R6881 Early satiety: Secondary | ICD-10-CM

## 2022-07-23 DIAGNOSIS — R140 Abdominal distension (gaseous): Secondary | ICD-10-CM

## 2022-07-28 ENCOUNTER — Ambulatory Visit (HOSPITAL_COMMUNITY)
Admission: RE | Admit: 2022-07-28 | Discharge: 2022-07-28 | Disposition: A | Discharge status: Home / Self Care | Payer: Medicare Other | Source: Ambulatory Visit | Attending: Family Medicine | Admitting: Family Medicine

## 2022-07-28 DIAGNOSIS — R6881 Early satiety: Secondary | ICD-10-CM

## 2022-07-28 DIAGNOSIS — R140 Abdominal distension (gaseous): Secondary | ICD-10-CM | POA: Insufficient documentation

## 2022-07-28 MED ORDER — TECHNETIUM TC 99M SULFUR COLLOID
2.1000 | Freq: Once | INTRAVENOUS | Status: AC | PRN
  Administered 2022-07-28: 2.1 via ORAL

## 2023-10-26 ENCOUNTER — Other Ambulatory Visit: Payer: Self-pay | Admitting: Family Medicine

## 2023-10-26 DIAGNOSIS — R109 Unspecified abdominal pain: Secondary | ICD-10-CM

## 2023-11-02 ENCOUNTER — Ambulatory Visit
Admission: RE | Admit: 2023-11-02 | Discharge: 2023-11-02 | Disposition: A | Discharge status: Home / Self Care | Source: Ambulatory Visit | Attending: Family Medicine | Admitting: Family Medicine

## 2023-11-02 DIAGNOSIS — R109 Unspecified abdominal pain: Secondary | ICD-10-CM

## 2023-11-02 MED ORDER — IOPAMIDOL (ISOVUE-300) INJECTION 61%
100.0000 mL | Freq: Once | INTRAVENOUS | Status: AC | PRN
  Administered 2023-11-02: 100 mL via INTRAVENOUS

## 2023-11-08 ENCOUNTER — Other Ambulatory Visit (HOSPITAL_COMMUNITY): Payer: Self-pay | Admitting: Family Medicine

## 2023-11-08 DIAGNOSIS — K6389 Other specified diseases of intestine: Secondary | ICD-10-CM

## 2023-11-08 DIAGNOSIS — R9389 Abnormal findings on diagnostic imaging of other specified body structures: Secondary | ICD-10-CM

## 2023-11-15 ENCOUNTER — Encounter (HOSPITAL_COMMUNITY)
Admission: RE | Admit: 2023-11-15 | Discharge: 2023-11-15 | Disposition: A | Discharge status: Home / Self Care | Source: Ambulatory Visit | Attending: Family Medicine | Admitting: Family Medicine

## 2023-11-15 DIAGNOSIS — R9389 Abnormal findings on diagnostic imaging of other specified body structures: Secondary | ICD-10-CM | POA: Insufficient documentation

## 2023-11-15 DIAGNOSIS — K6389 Other specified diseases of intestine: Secondary | ICD-10-CM | POA: Diagnosis present

## 2023-11-15 MED ORDER — COPPER CU 64 DOTATATE 1 MCI/ML IV SOLN
4.0000 | Freq: Once | INTRAVENOUS | Status: AC
  Administered 2023-11-15: 4.12 via INTRAVENOUS

## 2023-11-18 ENCOUNTER — Encounter (HOSPITAL_COMMUNITY): Payer: Self-pay | Admitting: Family Medicine

## 2023-11-18 ENCOUNTER — Other Ambulatory Visit (HOSPITAL_COMMUNITY): Payer: Self-pay | Admitting: Family Medicine

## 2023-11-18 DIAGNOSIS — K6389 Other specified diseases of intestine: Secondary | ICD-10-CM

## 2023-11-21 ENCOUNTER — Encounter: Payer: Self-pay | Admitting: Radiology

## 2023-11-21 NOTE — Progress Notes (Signed)
 Lauren Mccoy LABOR, MD  Lauren Mccoy, RT Do not schedule.  There is no percutaneous access to perform the biopsy.  We cannot do this.       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

## 2023-11-28 ENCOUNTER — Telehealth (HOSPITAL_COMMUNITY): Payer: Self-pay

## 2023-11-28 ENCOUNTER — Encounter: Payer: Self-pay | Admitting: *Deleted

## 2023-11-28 NOTE — Telephone Encounter (Signed)
-----   Message from Lauren Mccoy sent at 11/28/2023  9:58 AM EDT ----- Routine follow up with imaging or discuss case with gen surg. ----- Message ----- From: Carolee Rosina BIRCH Sent: 11/22/2023  11:04 AM EDT To: Lauren DELENA Banner, MD  Dr. Banner,   I got your review regarding this pt not being a candidate for biopsy. The office is wanting to know if you have any recommendations for what they can do?  Thanks,  Erwin
